# Patient Record
Sex: Female | Born: 1965 | Race: White | Hispanic: No | Marital: Married | Smoking: Never smoker
Health system: Southern US, Community
[De-identification: ages and names within clinical notes are randomized; demographics above are authoritative.]

## PROBLEM LIST (undated history)

## (undated) DIAGNOSIS — R232 Flushing: Secondary | ICD-10-CM

## (undated) DIAGNOSIS — D649 Anemia, unspecified: Secondary | ICD-10-CM

## (undated) DIAGNOSIS — J45909 Unspecified asthma, uncomplicated: Secondary | ICD-10-CM

## (undated) DIAGNOSIS — C50919 Malignant neoplasm of unspecified site of unspecified female breast: Secondary | ICD-10-CM

## (undated) DIAGNOSIS — J309 Allergic rhinitis, unspecified: Secondary | ICD-10-CM

## (undated) HISTORY — DX: Allergic rhinitis, unspecified: J30.9

## (undated) HISTORY — PX: APPENDECTOMY: SHX54

## (undated) HISTORY — PX: BREAST ENHANCEMENT SURGERY: SHX7

## (undated) HISTORY — DX: Unspecified asthma, uncomplicated: J45.909

## (undated) HISTORY — PX: TRANSFER ADJACENT TISSUE TRUNK: SUR1372

## (undated) HISTORY — PX: MASTECTOMY: SHX3

---

## 1998-07-12 ENCOUNTER — Other Ambulatory Visit: Admission: RE | Admit: 1998-07-12 | Discharge: 1998-07-12 | Payer: Self-pay | Admitting: Obstetrics and Gynecology

## 1999-01-23 ENCOUNTER — Other Ambulatory Visit: Admission: RE | Admit: 1999-01-23 | Discharge: 1999-01-23 | Payer: Self-pay | Admitting: Obstetrics and Gynecology

## 1999-03-07 ENCOUNTER — Encounter (INDEPENDENT_AMBULATORY_CARE_PROVIDER_SITE_OTHER): Payer: Self-pay

## 1999-03-07 ENCOUNTER — Other Ambulatory Visit: Admission: RE | Admit: 1999-03-07 | Discharge: 1999-03-07 | Payer: Self-pay | Admitting: Obstetrics and Gynecology

## 1999-09-23 ENCOUNTER — Other Ambulatory Visit: Admission: RE | Admit: 1999-09-23 | Discharge: 1999-09-23 | Payer: Self-pay | Admitting: Obstetrics and Gynecology

## 1999-09-24 ENCOUNTER — Encounter (INDEPENDENT_AMBULATORY_CARE_PROVIDER_SITE_OTHER): Payer: Self-pay | Admitting: Specialist

## 1999-09-24 ENCOUNTER — Other Ambulatory Visit: Admission: RE | Admit: 1999-09-24 | Discharge: 1999-09-24 | Payer: Self-pay | Admitting: Obstetrics and Gynecology

## 2000-04-07 ENCOUNTER — Other Ambulatory Visit: Admission: RE | Admit: 2000-04-07 | Discharge: 2000-04-07 | Payer: Self-pay | Admitting: Obstetrics and Gynecology

## 2000-05-27 ENCOUNTER — Encounter (INDEPENDENT_AMBULATORY_CARE_PROVIDER_SITE_OTHER): Payer: Self-pay | Admitting: Specialist

## 2000-05-27 ENCOUNTER — Other Ambulatory Visit: Admission: RE | Admit: 2000-05-27 | Discharge: 2000-05-27 | Payer: Self-pay | Admitting: Obstetrics and Gynecology

## 2000-07-07 ENCOUNTER — Other Ambulatory Visit: Admission: RE | Admit: 2000-07-07 | Discharge: 2000-07-07 | Payer: Self-pay | Admitting: Obstetrics and Gynecology

## 2001-05-19 ENCOUNTER — Other Ambulatory Visit: Admission: RE | Admit: 2001-05-19 | Discharge: 2001-05-19 | Payer: Self-pay | Admitting: Obstetrics and Gynecology

## 2002-06-09 ENCOUNTER — Other Ambulatory Visit: Admission: RE | Admit: 2002-06-09 | Discharge: 2002-06-09 | Payer: Self-pay | Admitting: Obstetrics and Gynecology

## 2003-06-13 ENCOUNTER — Other Ambulatory Visit: Admission: RE | Admit: 2003-06-13 | Discharge: 2003-06-13 | Payer: Self-pay | Admitting: Obstetrics and Gynecology

## 2004-06-19 ENCOUNTER — Other Ambulatory Visit: Admission: RE | Admit: 2004-06-19 | Discharge: 2004-06-19 | Payer: Self-pay | Admitting: Obstetrics and Gynecology

## 2005-07-15 ENCOUNTER — Other Ambulatory Visit: Admission: RE | Admit: 2005-07-15 | Discharge: 2005-07-15 | Payer: Self-pay | Admitting: Obstetrics and Gynecology

## 2007-11-17 ENCOUNTER — Encounter (INDEPENDENT_AMBULATORY_CARE_PROVIDER_SITE_OTHER): Payer: Self-pay | Admitting: Diagnostic Radiology

## 2007-11-17 ENCOUNTER — Encounter: Admission: RE | Admit: 2007-11-17 | Discharge: 2007-11-17 | Payer: Self-pay | Admitting: Obstetrics and Gynecology

## 2007-11-24 ENCOUNTER — Encounter: Admission: RE | Admit: 2007-11-24 | Discharge: 2007-11-24 | Payer: Self-pay | Admitting: Obstetrics and Gynecology

## 2007-11-25 ENCOUNTER — Encounter (INDEPENDENT_AMBULATORY_CARE_PROVIDER_SITE_OTHER): Payer: Self-pay | Admitting: Diagnostic Radiology

## 2007-11-25 ENCOUNTER — Encounter: Admission: RE | Admit: 2007-11-25 | Discharge: 2007-11-25 | Payer: Self-pay | Admitting: Obstetrics and Gynecology

## 2007-12-01 ENCOUNTER — Ambulatory Visit: Payer: Self-pay | Admitting: Hematology

## 2008-01-20 IMAGING — CR DG CHEST 2V
2 series · 2 of 2 positions shown · non-contrast
Comparison: None

CLINICAL DATA: History of left breast carcinoma.

CHEST - 2 VIEW

[view not recorded (1 of 2)]
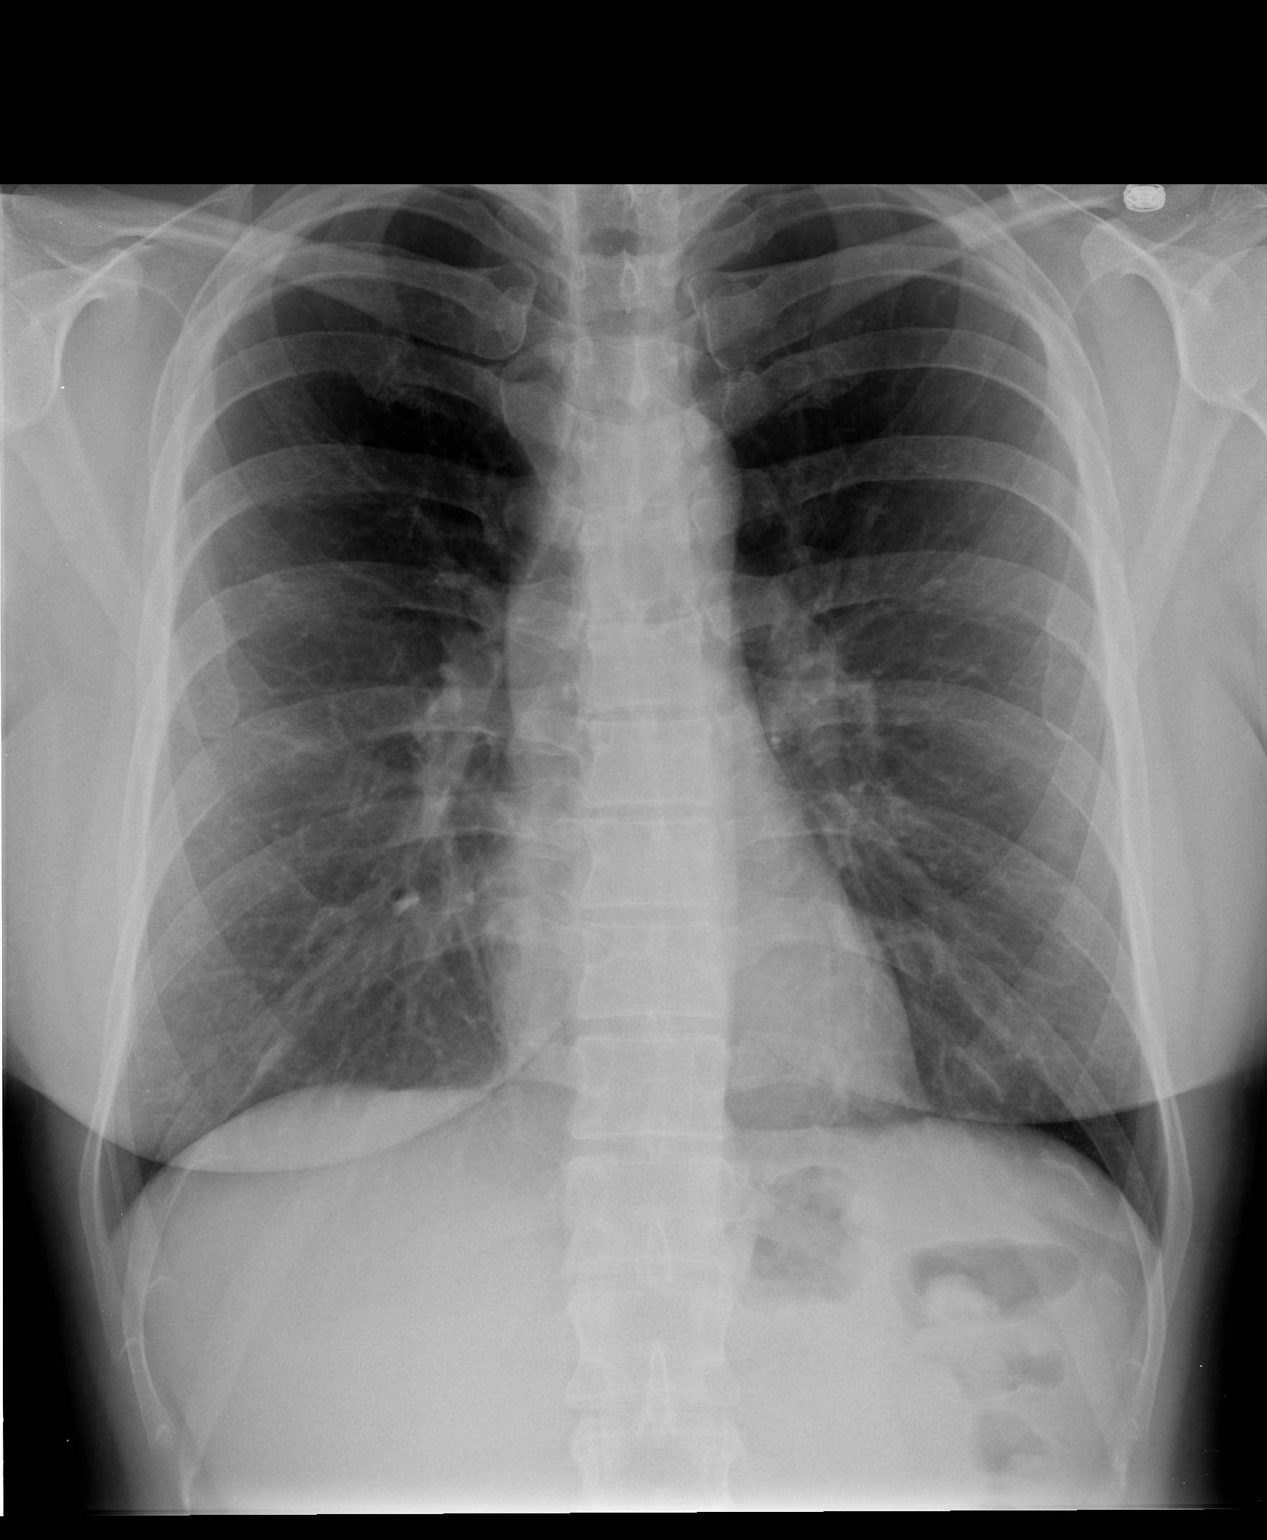

[view not recorded (2 of 2)]
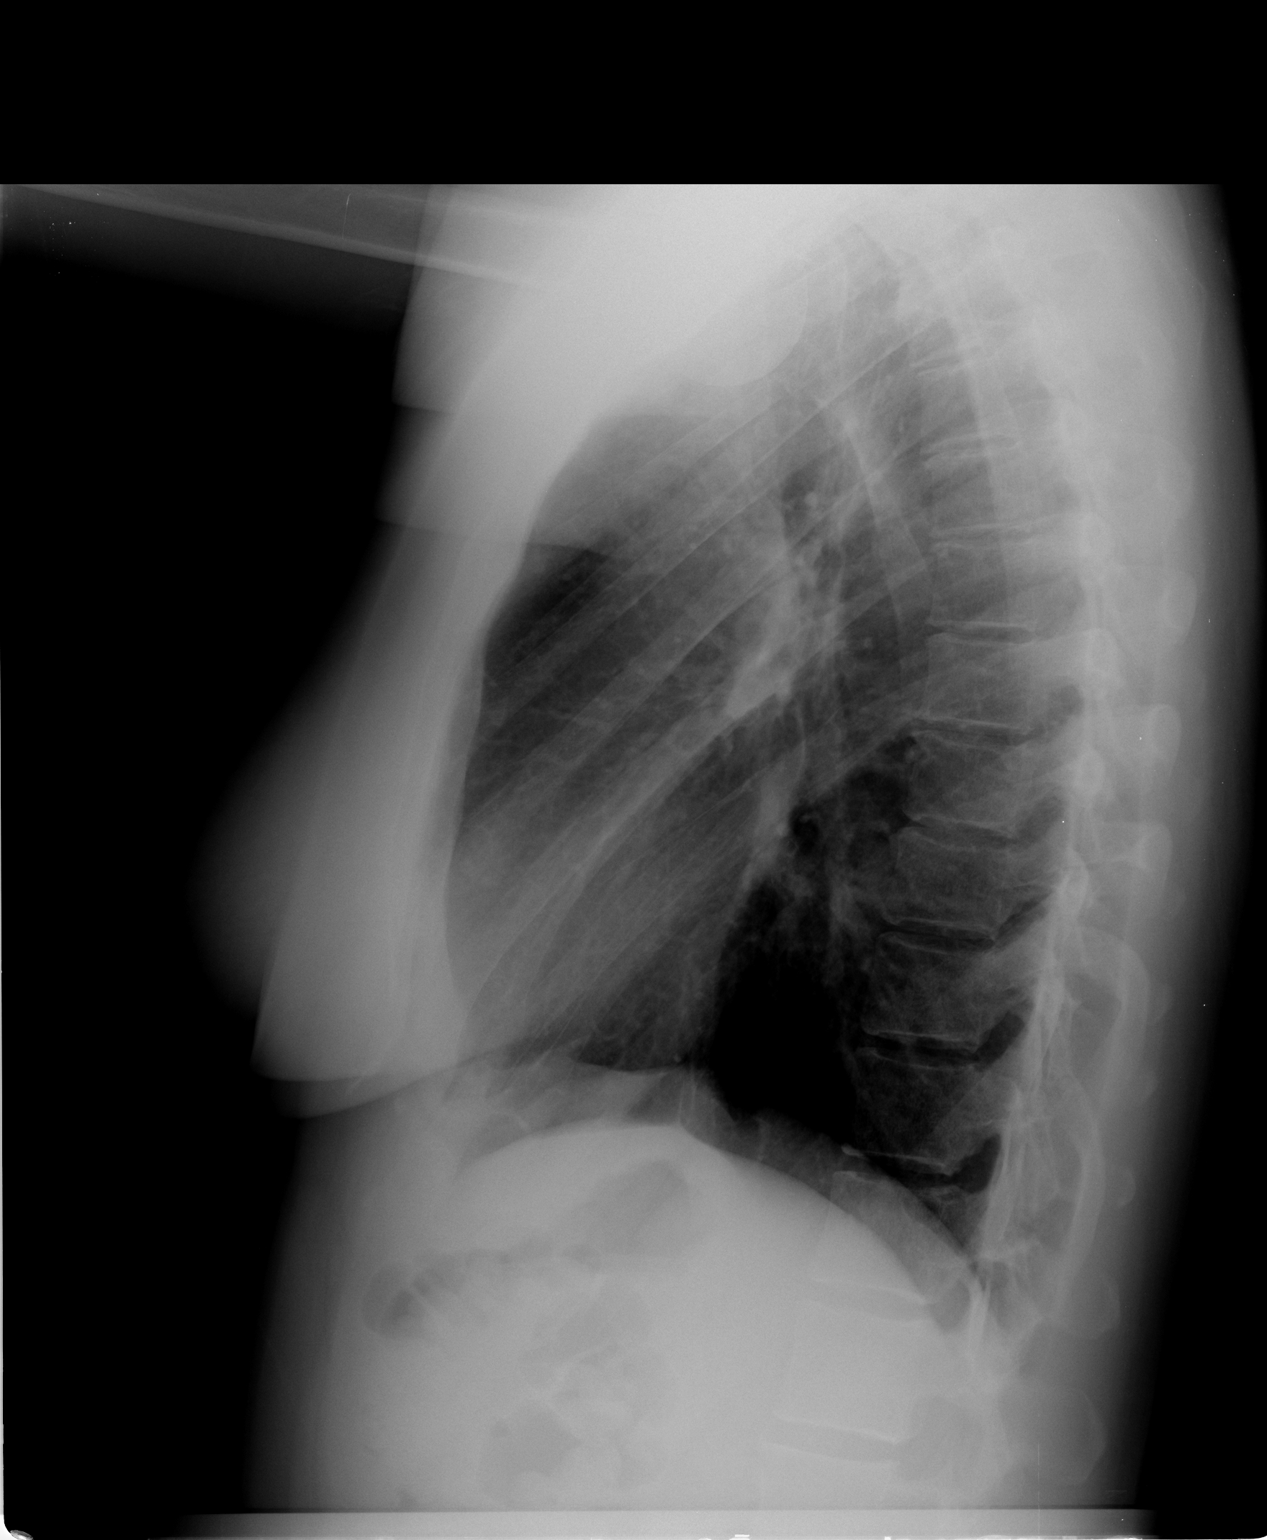

[2 of 2 positions shown; findings below may reference images not displayed]

FINDINGS: Cardiac silhouette is normal size and shape.  Lungs are
free of infiltrates.  No pleural effusion, adenopathy, or
mediastinal lesion is seen.  No pleural disease is evident.  Bones
appear average for age with only minimal degenerative spondylosis.
IMPRESSION: No acute or active process seen.

## 2008-01-24 ENCOUNTER — Encounter (INDEPENDENT_AMBULATORY_CARE_PROVIDER_SITE_OTHER): Payer: Self-pay | Admitting: General Surgery

## 2008-01-24 ENCOUNTER — Ambulatory Visit (HOSPITAL_COMMUNITY): Admission: RE | Admit: 2008-01-24 | Discharge: 2008-01-25 | Payer: Self-pay | Admitting: General Surgery

## 2008-01-31 ENCOUNTER — Ambulatory Visit: Payer: Self-pay | Admitting: Oncology

## 2008-02-16 LAB — CBC WITH DIFFERENTIAL/PLATELET
BASO%: 0.3 % (ref 0.0–2.0)
Basophils Absolute: 0 10*3/uL (ref 0.0–0.1)
EOS%: 8.8 % — ABNORMAL HIGH (ref 0.0–7.0)
HGB: 14.1 g/dL (ref 11.6–15.9)
MCH: 32.5 pg (ref 26.0–34.0)
MCHC: 34.9 g/dL (ref 32.0–36.0)
MCV: 93.1 fL (ref 81.0–101.0)
MONO%: 10.1 % (ref 0.0–13.0)
RDW: 11.9 % (ref 11.3–14.5)

## 2008-02-17 LAB — COMPREHENSIVE METABOLIC PANEL
ALT: 140 U/L — ABNORMAL HIGH (ref 0–35)
AST: 73 U/L — ABNORMAL HIGH (ref 0–37)
Albumin: 4.5 g/dL (ref 3.5–5.2)
Alkaline Phosphatase: 69 U/L (ref 39–117)
BUN: 12 mg/dL (ref 6–23)
Creatinine, Ser: 0.89 mg/dL (ref 0.40–1.20)
Potassium: 4.2 mEq/L (ref 3.5–5.3)

## 2008-02-17 LAB — VITAMIN D 25 HYDROXY (VIT D DEFICIENCY, FRACTURES): Vit D, 25-Hydroxy: 30 ng/mL (ref 30–89)

## 2008-02-22 ENCOUNTER — Ambulatory Visit (HOSPITAL_COMMUNITY): Admission: RE | Admit: 2008-02-22 | Discharge: 2008-02-22 | Payer: Self-pay | Admitting: Oncology

## 2008-03-09 LAB — HEPATIC FUNCTION PANEL
Bilirubin, Direct: 0.1 mg/dL (ref 0.0–0.3)
Total Bilirubin: 0.5 mg/dL (ref 0.3–1.2)

## 2008-03-17 ENCOUNTER — Ambulatory Visit: Payer: Self-pay | Admitting: Oncology

## 2008-03-17 LAB — CBC WITH DIFFERENTIAL/PLATELET
Eosinophils Absolute: 0 10*3/uL (ref 0.0–0.5)
HCT: 34.5 % — ABNORMAL LOW (ref 34.8–46.6)
HGB: 11.9 g/dL (ref 11.6–15.9)
LYMPH%: 5.5 % — ABNORMAL LOW (ref 14.0–48.0)
MONO#: 1.5 10*3/uL — ABNORMAL HIGH (ref 0.1–0.9)
NEUT#: 34.9 10*3/uL — ABNORMAL HIGH (ref 1.5–6.5)
NEUT%: 90.5 % — ABNORMAL HIGH (ref 39.6–76.8)
Platelets: 196 10*3/uL (ref 145–400)
WBC: 38.5 10*3/uL — ABNORMAL HIGH (ref 3.9–10.0)

## 2008-03-31 LAB — CBC WITH DIFFERENTIAL/PLATELET
Eosinophils Absolute: 0 10*3/uL (ref 0.0–0.5)
LYMPH%: 6.3 % — ABNORMAL LOW (ref 14.0–48.0)
MCH: 31 pg (ref 26.0–34.0)
MCHC: 35.2 g/dL (ref 32.0–36.0)
MCV: 88.1 fL (ref 81.0–101.0)
MONO%: 10.7 % (ref 0.0–13.0)
NEUT#: 14.7 10*3/uL — ABNORMAL HIGH (ref 1.5–6.5)
Platelets: 417 10*3/uL — ABNORMAL HIGH (ref 145–400)
RBC: 4.18 10*6/uL (ref 3.70–5.32)

## 2008-03-31 LAB — COMPREHENSIVE METABOLIC PANEL
AST: 68 U/L — ABNORMAL HIGH (ref 0–37)
Albumin: 4.5 g/dL (ref 3.5–5.2)
BUN: 12 mg/dL (ref 6–23)
Calcium: 9.7 mg/dL (ref 8.4–10.5)
Chloride: 105 mEq/L (ref 96–112)
Potassium: 4.1 mEq/L (ref 3.5–5.3)

## 2008-04-07 ENCOUNTER — Ambulatory Visit: Admission: RE | Admit: 2008-04-07 | Discharge: 2008-04-18 | Payer: Self-pay | Admitting: Radiation Oncology

## 2008-04-07 LAB — CBC WITH DIFFERENTIAL/PLATELET
BASO%: 0.1 % (ref 0.0–2.0)
Basophils Absolute: 0 10*3/uL (ref 0.0–0.1)
EOS%: 1.1 % (ref 0.0–7.0)
HGB: 12 g/dL (ref 11.6–15.9)
MCH: 32 pg (ref 26.0–34.0)
MCHC: 35 g/dL (ref 32.0–36.0)
RDW: 12.5 % (ref 11.3–14.5)
lymph#: 1.1 10*3/uL (ref 0.9–3.3)

## 2008-04-28 ENCOUNTER — Ambulatory Visit: Payer: Self-pay | Admitting: Oncology

## 2008-04-28 LAB — CBC WITH DIFFERENTIAL/PLATELET
Basophils Absolute: 0 10*3/uL (ref 0.0–0.1)
HCT: 38.5 % (ref 34.8–46.6)
HGB: 13.3 g/dL (ref 11.6–15.9)
MONO#: 0.5 10*3/uL (ref 0.1–0.9)
NEUT%: 90.1 % — ABNORMAL HIGH (ref 39.6–76.8)
WBC: 14.1 10*3/uL — ABNORMAL HIGH (ref 3.9–10.0)
lymph#: 0.8 10*3/uL — ABNORMAL LOW (ref 0.9–3.3)

## 2008-04-28 LAB — COMPREHENSIVE METABOLIC PANEL
ALT: 178 U/L — ABNORMAL HIGH (ref 0–35)
BUN: 14 mg/dL (ref 6–23)
CO2: 25 mEq/L (ref 19–32)
Calcium: 9.3 mg/dL (ref 8.4–10.5)
Chloride: 103 mEq/L (ref 96–112)
Creatinine, Ser: 0.72 mg/dL (ref 0.40–1.20)

## 2008-05-05 LAB — CBC WITH DIFFERENTIAL/PLATELET
BASO%: 0.3 % (ref 0.0–2.0)
HCT: 33.9 % — ABNORMAL LOW (ref 34.8–46.6)
HGB: 11.6 g/dL (ref 11.6–15.9)
MCHC: 34.3 g/dL (ref 32.0–36.0)
MONO#: 2.3 10*3/uL — ABNORMAL HIGH (ref 0.1–0.9)
NEUT%: 80.6 % — ABNORMAL HIGH (ref 39.6–76.8)
RDW: 14 % (ref 11.3–14.5)
WBC: 20.3 10*3/uL — ABNORMAL HIGH (ref 3.9–10.0)
lymph#: 1.3 10*3/uL (ref 0.9–3.3)

## 2008-05-05 LAB — COMPREHENSIVE METABOLIC PANEL
ALT: 66 U/L — ABNORMAL HIGH (ref 0–35)
Albumin: 3.5 g/dL (ref 3.5–5.2)
CO2: 26 mEq/L (ref 19–32)
Calcium: 8.6 mg/dL (ref 8.4–10.5)
Chloride: 103 mEq/L (ref 96–112)
Creatinine, Ser: 0.78 mg/dL (ref 0.40–1.20)
Potassium: 3.6 mEq/L (ref 3.5–5.3)
Total Protein: 6.2 g/dL (ref 6.0–8.3)

## 2008-05-19 LAB — CBC WITH DIFFERENTIAL/PLATELET
Basophils Absolute: 0.3 10*3/uL — ABNORMAL HIGH (ref 0.0–0.1)
Eosinophils Absolute: 0 10*3/uL (ref 0.0–0.5)
HCT: 36.8 % (ref 34.8–46.6)
LYMPH%: 3.3 % — ABNORMAL LOW (ref 14.0–48.0)
MCV: 92.2 fL (ref 81.0–101.0)
MONO%: 4.4 % (ref 0.0–13.0)
NEUT%: 90.5 % — ABNORMAL HIGH (ref 39.6–76.8)
Platelets: 279 10*3/uL (ref 145–400)
WBC: 16.3 10*3/uL — ABNORMAL HIGH (ref 3.9–10.0)

## 2008-05-26 LAB — CBC WITH DIFFERENTIAL/PLATELET
Basophils Absolute: 0 10*3/uL (ref 0.0–0.1)
EOS%: 0.7 % (ref 0.0–7.0)
HCT: 35.2 % (ref 34.8–46.6)
HGB: 12.2 g/dL (ref 11.6–15.9)
MCH: 32.1 pg (ref 26.0–34.0)
MCV: 92.7 fL (ref 81.0–101.0)
MONO%: 14.8 % — ABNORMAL HIGH (ref 0.0–13.0)
NEUT%: 76 % (ref 39.6–76.8)

## 2008-05-26 LAB — COMPREHENSIVE METABOLIC PANEL
AST: 24 U/L (ref 0–37)
Alkaline Phosphatase: 105 U/L (ref 39–117)
BUN: 10 mg/dL (ref 6–23)
Calcium: 9.1 mg/dL (ref 8.4–10.5)
Chloride: 103 mEq/L (ref 96–112)
Creatinine, Ser: 0.85 mg/dL (ref 0.40–1.20)
Glucose, Bld: 89 mg/dL (ref 70–99)

## 2008-07-04 ENCOUNTER — Encounter (INDEPENDENT_AMBULATORY_CARE_PROVIDER_SITE_OTHER): Payer: Self-pay | Admitting: Plastic Surgery

## 2008-07-04 ENCOUNTER — Inpatient Hospital Stay (HOSPITAL_COMMUNITY): Admission: RE | Admit: 2008-07-04 | Discharge: 2008-07-06 | Payer: Self-pay | Admitting: Plastic Surgery

## 2008-08-14 ENCOUNTER — Ambulatory Visit: Payer: Self-pay | Admitting: Oncology

## 2008-08-16 LAB — CBC WITH DIFFERENTIAL/PLATELET
BASO%: 0.3 % (ref 0.0–2.0)
EOS%: 5.4 % (ref 0.0–7.0)
MCH: 31.9 pg (ref 25.1–34.0)
MCHC: 34.7 g/dL (ref 31.5–36.0)
MONO#: 0.6 10*3/uL (ref 0.1–0.9)
RDW: 12.5 % (ref 11.2–14.5)
WBC: 4.7 10*3/uL (ref 3.9–10.3)
lymph#: 1 10*3/uL (ref 0.9–3.3)

## 2008-08-17 LAB — COMPREHENSIVE METABOLIC PANEL
ALT: 12 U/L (ref 0–35)
AST: 15 U/L (ref 0–37)
Albumin: 4.2 g/dL (ref 3.5–5.2)
CO2: 26 mEq/L (ref 19–32)
Calcium: 9.1 mg/dL (ref 8.4–10.5)
Chloride: 102 mEq/L (ref 96–112)
Potassium: 4.2 mEq/L (ref 3.5–5.3)
Sodium: 138 mEq/L (ref 135–145)
Total Protein: 6.9 g/dL (ref 6.0–8.3)

## 2008-10-19 ENCOUNTER — Encounter: Admission: RE | Admit: 2008-10-19 | Discharge: 2008-10-19 | Payer: Self-pay | Admitting: Oncology

## 2009-02-19 ENCOUNTER — Ambulatory Visit: Payer: Self-pay | Admitting: Oncology

## 2009-02-21 LAB — COMPREHENSIVE METABOLIC PANEL
AST: 23 U/L (ref 0–37)
Albumin: 3.7 g/dL (ref 3.5–5.2)
Alkaline Phosphatase: 67 U/L (ref 39–117)
BUN: 12 mg/dL (ref 6–23)
Calcium: 8.9 mg/dL (ref 8.4–10.5)
Chloride: 101 mEq/L (ref 96–112)
Creatinine, Ser: 0.94 mg/dL (ref 0.40–1.20)
Glucose, Bld: 100 mg/dL — ABNORMAL HIGH (ref 70–99)
Potassium: 3.9 mEq/L (ref 3.5–5.3)

## 2009-02-21 LAB — CBC WITH DIFFERENTIAL/PLATELET
BASO%: 0.3 % (ref 0.0–2.0)
HCT: 38.2 % (ref 34.8–46.6)
MCHC: 34.4 g/dL (ref 31.5–36.0)
MONO#: 0.7 10*3/uL (ref 0.1–0.9)
NEUT#: 3.6 10*3/uL (ref 1.5–6.5)
NEUT%: 62.5 % (ref 38.4–76.8)
RBC: 4 10*6/uL (ref 3.70–5.45)
WBC: 5.8 10*3/uL (ref 3.9–10.3)
lymph#: 1.2 10*3/uL (ref 0.9–3.3)

## 2009-02-22 LAB — VITAMIN D 25 HYDROXY (VIT D DEFICIENCY, FRACTURES): Vit D, 25-Hydroxy: 31 ng/mL (ref 30–89)

## 2009-02-22 LAB — CANCER ANTIGEN 27.29: CA 27.29: 18 U/mL (ref 0–39)

## 2009-04-23 ENCOUNTER — Ambulatory Visit: Payer: Self-pay | Admitting: Oncology

## 2009-10-16 ENCOUNTER — Ambulatory Visit: Payer: Self-pay | Admitting: Oncology

## 2009-10-18 LAB — COMPREHENSIVE METABOLIC PANEL
Albumin: 4.1 g/dL (ref 3.5–5.2)
BUN: 9 mg/dL (ref 6–23)
Calcium: 9 mg/dL (ref 8.4–10.5)
Chloride: 106 mEq/L (ref 96–112)
Creatinine, Ser: 0.87 mg/dL (ref 0.40–1.20)
Glucose, Bld: 103 mg/dL — ABNORMAL HIGH (ref 70–99)
Sodium: 139 mEq/L (ref 135–145)
Total Bilirubin: 0.3 mg/dL (ref 0.3–1.2)

## 2009-10-18 LAB — CBC WITH DIFFERENTIAL/PLATELET
BASO%: 0.6 % (ref 0.0–2.0)
Basophils Absolute: 0 10*3/uL (ref 0.0–0.1)
MCHC: 34 g/dL (ref 31.5–36.0)
MONO%: 11.3 % (ref 0.0–14.0)
NEUT#: 3.4 10*3/uL (ref 1.5–6.5)
RBC: 3.95 10*6/uL (ref 3.70–5.45)
lymph#: 1.2 10*3/uL (ref 0.9–3.3)

## 2009-10-18 LAB — CANCER ANTIGEN 27.29: CA 27.29: 22 U/mL (ref 0–39)

## 2009-10-18 LAB — LACTATE DEHYDROGENASE: LDH: 161 U/L (ref 94–250)

## 2009-10-25 ENCOUNTER — Encounter: Admission: RE | Admit: 2009-10-25 | Discharge: 2009-10-25 | Payer: Self-pay | Admitting: Oncology

## 2009-10-29 LAB — ESTRADIOL, ULTRA SENS

## 2009-12-18 ENCOUNTER — Ambulatory Visit (HOSPITAL_BASED_OUTPATIENT_CLINIC_OR_DEPARTMENT_OTHER): Payer: BC Managed Care – PPO | Admitting: Oncology

## 2010-05-28 ENCOUNTER — Encounter (HOSPITAL_BASED_OUTPATIENT_CLINIC_OR_DEPARTMENT_OTHER): Payer: BC Managed Care – PPO | Admitting: Oncology

## 2010-05-28 DIAGNOSIS — C50319 Malignant neoplasm of lower-inner quadrant of unspecified female breast: Secondary | ICD-10-CM

## 2010-05-28 DIAGNOSIS — C50919 Malignant neoplasm of unspecified site of unspecified female breast: Secondary | ICD-10-CM

## 2010-05-28 LAB — CBC WITH DIFFERENTIAL/PLATELET
BASO%: 0.4 % (ref 0.0–2.0)
EOS%: 4.6 % (ref 0.0–7.0)
HGB: 13.1 g/dL (ref 11.6–15.9)
MCH: 30.4 pg (ref 25.1–34.0)
MCV: 89.2 fL (ref 79.5–101.0)
MONO%: 9.7 % (ref 0.0–14.0)
RBC: 4.3 10*6/uL (ref 3.70–5.45)
WBC: 8.4 10*3/uL (ref 3.9–10.3)

## 2010-05-28 LAB — COMPREHENSIVE METABOLIC PANEL
ALT: 17 U/L (ref 0–35)
AST: 22 U/L (ref 0–37)
Albumin: 3.8 g/dL (ref 3.5–5.2)
BUN: 10 mg/dL (ref 6–23)
CO2: 29 mEq/L (ref 19–32)
Chloride: 102 mEq/L (ref 96–112)
Glucose, Bld: 93 mg/dL (ref 70–99)
Total Bilirubin: 0.5 mg/dL (ref 0.3–1.2)
Total Protein: 7.1 g/dL (ref 6.0–8.3)

## 2010-05-28 LAB — CANCER ANTIGEN 27.29: CA 27.29: 13 U/mL (ref 0–39)

## 2010-06-07 ENCOUNTER — Other Ambulatory Visit: Payer: Self-pay | Admitting: Obstetrics and Gynecology

## 2010-06-07 ENCOUNTER — Ambulatory Visit (HOSPITAL_COMMUNITY)
Admission: RE | Admit: 2010-06-07 | Discharge: 2010-06-07 | Disposition: A | Discharge status: Home / Self Care | Payer: BC Managed Care – PPO | Source: Ambulatory Visit | Attending: Obstetrics and Gynecology | Admitting: Obstetrics and Gynecology

## 2010-06-07 DIAGNOSIS — N915 Oligomenorrhea, unspecified: Secondary | ICD-10-CM | POA: Insufficient documentation

## 2010-06-07 DIAGNOSIS — N83209 Unspecified ovarian cyst, unspecified side: Secondary | ICD-10-CM | POA: Insufficient documentation

## 2010-06-07 DIAGNOSIS — Z853 Personal history of malignant neoplasm of breast: Secondary | ICD-10-CM | POA: Insufficient documentation

## 2010-06-07 DIAGNOSIS — N840 Polyp of corpus uteri: Secondary | ICD-10-CM | POA: Insufficient documentation

## 2010-06-07 LAB — CBC
HCT: 40.4 % (ref 36.0–46.0)
Hemoglobin: 13.3 g/dL (ref 12.0–15.0)
MCH: 29.6 pg (ref 26.0–34.0)
RBC: 4.5 MIL/uL (ref 3.87–5.11)
WBC: 6.7 10*3/uL (ref 4.0–10.5)

## 2010-06-12 NOTE — H&P (Signed)
  NAMEGIARA, [name] [name] NO.:  [id number]  MEDICAL RECORD NO.:  [id number]        PATIENT TYPE:  WAMB  LOCATION:                                FACILITY:  [facility]  PHYSICIAN:  [name], M.D.   DATE OF BIRTH:  01/14/1966  DATE OF ADMISSION:  06/07/2010 DATE OF DISCHARGE:                             HISTORY & PHYSICAL   The patient is a 45 year old, gravida 2, para 2, female who presents for hysteroscopy.  In relation to the present admission, the patient has a history of oligomenorrhea.  Is on tamoxifen for past history of breast cancer.  She underwent a saline infusion ultrasound had numerous endometrial polyps that were noted.  Also had a 3.5-cm simple cyst in the right ovary.  It was felt that that was a functional cyst, but she now presents for a hysteroscopic resection of the polyp.  In terms of allergies, ho known drug allergies.  MEDICATIONS:  She is on tamoxifen daily.  She is on Effexor as well as vitamin replacements.  PAST MEDICAL HISTORY:  Significant in that she has had a history of breast cancer diagnosed in 2010, otherwise, usual childhood diseases.  In terms of past surgical history, this includes an appendectomy, C- section, LASIK, and she did have a left mastectomy with reconstruction using a TRAM flap.  FAMILY HISTORY:  Noncontributory.  SOCIAL HISTORY:  No tobacco, alcohol use.  REVIEW OF SYSTEMS:  Noncontributory.  PHYSICAL EXAMINATION:  VITAL SIGNS:  The patient is afebrile, stable vital signs. HEENT:  The patient is normocephalic.  Pupils equal, round, and reactive to light and accommodation.  Extraocular movements were intact.  Sclerae and conjunctivae clear.  Oropharynx clear. NECK:  Not examined. BREASTS:  The patient has had previous left mastectomy with TRAM flap. There are no other abnormalities.  Right breast is unremarkable.  No masses, adenopathy, or nipple discharge. LUNGS:  Clear. CARDIOVASCULAR:  Regular rate.   No murmurs or gallops. ABDOMEN:  Benign.  No mass, organomegaly, or tenderness.  Does have a transverse incision and periumbilical incision for formation of the TRAM flap. PELVIC:  Normal external genitalia.  Vaginal mucosa is clear.  Cervix unremarkable.  Uterus normal size, shape, and contour.  Adnexa free of masses or tenderness. EXTREMITIES:  Trace edema. NEUROLOGIC:  Grossly normal limits.  IMPRESSION:  Endometrial polyps. 1. History of breast cancer on tamoxifen.  PLAN:  The patient undergo hysteroscopy evaluation resection.  The risks of surgery have been discussed with the risk of infection.  The risk of hemorrhage that could require transfusion with the risk of AIDS or hepatitis..  Excessive bleeding could require hysterectomy as a risk of perforation of injury to injury to adjacent organs requiring exploratory surgery.  Risk of deep venous thrombosis and pulmonary embolus.  The patient does understand potential risks and complications.     [name], M.D.     JSM/MEDQ  D:  06/07/2010  T:  06/07/2010  Job:  [id number]  Electronically Signed by Richardean Chimera M.D. on 06/12/2010 07:46:59 AM

## 2010-06-12 NOTE — Op Note (Signed)
  [name] [name], [name] [name]                ACCOUNT NO.:  [id number]  MEDICAL RECORD NO.:  [id number]           PATIENT TYPE:  O  LOCATION:  [facility]                          FACILITY:  [facility]  PHYSICIAN:  [name], M.D.   DATE OF BIRTH:  [birth year]  DATE OF PROCEDURE:  06/07/2010 DATE OF DISCHARGE:                              OPERATIVE REPORT   PREOPERATIVE DIAGNOSIS:  Endometrial polyps.  POSTOPERATIVE DIAGNOSIS:  Thickened endometrium probably from tamoxifen.  OPERATIVE PROCEDURE:  Paracervical block, hysteroscopy with multiple endometrial resections as well as endometrial curettings.  ANESTHESIA:  Paracervical block with general anesthesia.  ESTIMATED BLOOD LOSS:  Minimal.  PACKS AND DRAINS:  None.  INTRAOPERATIVE BLOOD PLACED:  None.  COMPLICATIONS:  None.  INDICATIONS:  Dictated in the history and physical.  PROCEDURE:  The patient was taken to the OR and placed in supine position.  After satisfactory level of general anesthesia was obtained, the patient was placed in dorsal lithotomy position using the Allen stirrups.  The perineum and vagina prepped out with Betadine and draped in sterile field.  A speculum was placed in the vaginal vault.  The cervix was single-tooth tenaculum.  A paracervical block of 1% Xylocaine with epinephrine was then instituted.  Uterus sounded to approximately 10 cm.  Cervix serially dilated to a size 31 Pratt dilator.  Operative hysteroscope was introduced, intrauterine cavity was distended using sorbitol.  Visualization revealed a typical findings of the endometrium when the patient had been on tamoxifen.  She had multiple irregularities with cystic structures.  Using the resectoscope, we actually resected the majority of the endometrium.  This all sent for pathological review. Total deficit was about 350 mL.  At this point in time, we obtained endometrial curettings.  There is no active bleeding signs of complications or perforations.  At  this point in time, single-tooth and speculum then removed.  The patient taken out of dorsal position once alert, extubated, and transferred to recovery room in good condition.     [name], M.D.     JSM/MEDQ  D:  06/07/2010  T:  06/07/2010  Job:  [id number]  Electronically Signed by Richardean Chimera M.D. on 06/12/2010 07:47:01 AM

## 2010-08-01 LAB — BASIC METABOLIC PANEL
CO2: 25 mEq/L (ref 19–32)
Creatinine, Ser: 0.71 mg/dL (ref 0.4–1.2)
GFR calc Af Amer: >60 mL/min (ref >60)
Potassium: 4.5 mEq/L (ref 3.5–5.1)
Sodium: 138 mEq/L (ref 135–145)

## 2010-08-01 LAB — HEMOGLOBIN AND HEMATOCRIT, BLOOD
HCT: 33.4 % — ABNORMAL LOW (ref 36.0–46.0)
Hemoglobin: 11.8 g/dL — ABNORMAL LOW (ref 12.0–15.0)

## 2010-08-01 LAB — CBC
HCT: 38 % (ref 36.0–46.0)
Hemoglobin: 13.5 g/dL (ref 12.0–15.0)
MCV: 94.3 fL (ref 78.0–100.0)
RDW: 13.5 % (ref 11.5–15.5)

## 2010-09-03 NOTE — Op Note (Signed)
NAMEJANASHIA, [name] [name]              ACCOUNT NO.:  [id number]   MEDICAL RECORD NO.:  [id number]          PATIENT TYPE:  OIB   LOCATION:  5127                         FACILITY:  [facility]   PHYSICIAN:  Ollen Gross. Vernell Morgans, M.D. DATE OF BIRTH:  01-20-1966   DATE OF PROCEDURE:  01/24/2008  DATE OF DISCHARGE:                               OPERATIVE REPORT   PREOPERATIVE DIAGNOSIS:  Left breast cancer.   POSTOPERATIVE DIAGNOSIS:  Left breast cancer.   PROCEDURE:  Left mastectomy and sentinel node biopsy with injection of  blue dye.   SURGEON:  Ollen Gross. Vernell Morgans, MD   ASSISTANT:  Magnus Ivan, RNFA   ANESTHESIA:  General endotracheal.   PROCEDURE:  After informed consent was obtained, the patient was brought  to the operating room and placed in the supine position on the operating  table.  After adequate induction of general anesthesia, the patient's  left breast, chest, and axilla were prepped with Betadine and draped in  usual sterile manner.  Earlier in the day, the patient undergone  injection of 1 mCi of technetium sulfur colloid in the subareolar  position.  At this point, 2 mL of methylene blue and 3 mL of injectable  saline were also injected in the subareolar position.  The breast was  massaged for several minutes.  An elliptical type incision was made  around the nipple-areolar complex so as to minimize excess skin.  This  incision was then carried down through the skin and subcutaneous tissue  sharply with the 10-blade knife and electrocautery.  Skin hooks were  used to elevate the skin flaps towards the ceiling and thin skin flaps  were then created dissecting between the breast tissue and the  subcutaneous fat.  This was done circumferentially until the dissection  was carried down through the chest wall laterally.  Once the dissection  was carried down to the axilla, the NeoProbe was used to identify a hot  spot in the axilla.  A combination of blunt hemostat dissection and  sharp dissection was used to dissect into the tissue until a hot blue  lymph node was identified.  It was excised sharply with the  electrocautery and had ex vivo counts around 1500.  It was sent to  pathology as sentinel node #1.  Second palpable lymph node was  identified that was not hot or blue.  It was also excised sharply with  the electrocautery, and sent as nonsentinel node.  No other hot spots,  palpable nodes, or blue dye were identified in the axilla.  Next, the  inferior skin flap was created in the similar manner dissecting between  the subcutaneous fat and the breast tissue with elevating the skin flaps  towards the ceiling.  This was carried down to the chest wall as well.  Laterally, the dissection was carried between the breast tissue and  subcutaneous fat down to the latissimus muscle.  The breast tissue was  then removed from the chest wall with the pectoralis fascia.  This was  all done sharply with the electrocautery.  Once the breast was  completely  removed, it was marked with a stitch on the lateral aspect  and sent to pathology for further evaluation, and hemostasis was  achieved using the Bovie electrocautery.  The wound was irrigated with  copious amounts of saline.  A small incision was made on the anterior  axillary line laterally with a 15-blade knife.  The tonsil clamp was  placed through this opening into the bed of the wound.  A 19-French  round Harrison Mons drain was then brought into the operative bed and out  through the incision.  The drain was placed along the chest wall.  The  drain was anchored to the skin with 3-0 nylon stitch.  The subcutaneous  tissue was then grossly reapproximated with several interrupted 3-0  Vicryl stitches, and then the skin was closed with a running 4-0  Monocryl subcuticular stitch.  Dermabond dressing was applied.  The  patient tolerated the procedure well.  At the end of the case, all  needle, sponge, and instrument counts were  correct.  The patient was  then awakened and taken to recovery room in stable condition.      Ollen Gross. Vernell Morgans, M.D.  Electronically Signed     PST/MEDQ  D:  01/24/2008  T:  01/25/2008  Job:  [id number]

## 2010-09-03 NOTE — Discharge Summary (Signed)
[name], [name] [name]              ACCOUNT NO.:  [id number]   MEDICAL RECORD NO.:  [id number]          PATIENT TYPE:  INP   LOCATION:  5153                         FACILITY:  [facility]   PHYSICIAN:  Etter Sjogren, M.D.     DATE OF BIRTH:  12-18-1965   DATE OF ADMISSION:  07/04/2008  DATE OF DISCHARGE:  07/06/2008                               DISCHARGE SUMMARY   FINAL DIAGNOSIS:  Left breast cancer required absence of the breast.   PROCEDURE:  Ipsilateral transverse rectus abdominis myocutaneous flap  and reconstruction of abdominal wall with mesh, placement of On-Q  catheter, all of that on July 04, 2008.   SUMMARY OF HISTORY AND PHYSICAL:  A 45 year old woman with breast  cancer, has had mastectomy, has had delay of a TRAM flap and has done  well from that.  She presented this time for the transfer of the TRAM  flap and reconstruction of her left breast.  Procedure risks as well as  complications discussed with her in detail and she understood those  risks and wished to proceed.  For the details of history and physical,  please see chart.   COURSE IN THE HOSPITAL:  On admission, she was taken to the Surgery, at  which time the TRAM reconstruction was performed.  She tolerated that  procedure well.  Postoperatively, she did extremely well.  Her  postoperative hemoglobin was 11.8 the following day, her preop was 13.5.  She was tolerating a diet on first postoperative day, ambulating,  voiding excellent urine output.  Flap had excellent color and no  evidence of any vascular compromise.  Abdomen also excellent color for  all of the abdominal skin as well the umbilicus.  No evidence of any  hematoma or any healing problems at this point.  Drains functioning 1  bulb on the second postoperative day was having little bit trouble  holding the charge for the expected length of time and that bulb was  replaced prior to her discharge.  She reports very very minimal  discomfort not requiring  any pain medication at the present time.   DISPOSITION:  She is discharged on a regular diet and no lifting 6  weeks, no driving 2 weeks, no shower with her drains in place.  She is  to empty  the drains at least 3 times a day and record the amounts and  remove the pain pump catheter tomorrow, and I have instructed her  husband how to do that.  Use spirometer 8 times a day at home.  She is  discharged on Percocet total of 30 given 2 p.o. q.6 p.r.n. pain, Keflex  250 mg p.o. q.i.d., and Robaxin 500 mg p.o. q.12.  I will see her back  in the office at the end of the next week.       Etter Sjogren, M.D.  Electronically Signed     DB/MEDQ  D:  07/06/2008  T:  07/07/2008  Job:  [id number]

## 2010-09-03 NOTE — Op Note (Signed)
NAMELAURALYN, SHADOWENS              ACCOUNT NO.:  [id number]   MEDICAL RECORD NO.:  [id number]          PATIENT TYPE:  INP   LOCATION:  5153                         FACILITY:  [facility]   PHYSICIAN:  Etter Sjogren, M.D.     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   POSTOPERATIVE DIAGNOSIS:  Left breast cancer acquired absence of the  breast.   PROCEDURES PERFORMED:  1. Left breast reconstruction with an ipsilateral transverse rectus      abdominis myocutaneous flap reconstruction.  2. Abdominal wall reconstruction with mesh.  3. Placement of On-Q pump catheter.   SURGEON:  Etter Sjogren, MD   ASSISTANT:  Loreta Ave, MD   ESTIMATED BLOOD LOSS:  200 mL.   DRAINS:  Four Blakes; two chest and two abdomen.   CLINICAL NOTE:  A 45 year old woman has had a mastectomy and has had a  previous delay of her TRAM flap.  She did prefer autogenous  reconstruction as opposed to implant.  The nature of the procedure and  the risks and possible complications were discussed with her and she  understood those very clearly and wished to proceed.   DESCRIPTION OF PROCEDURE:  The patient was marked in standing position  in the holding area for the TRAM reconstruction as well as the chest and  abdomen.  She was taken to the operating room and placed supine.  After  successful induction of general anesthesia, she was prepped with  Betadine and draped with sterile drapes.  The upper limb of the planned  TRAM incision was then made after first incising around the umbilicus  and dissecting free from front tissue leaving a de-generous fatty stalk,  especially on the right hand side or contralateral side of the  umbilicus.  The dissection was continued in a cephalad direction  beveling an upper direction to leave extra deep tissue  attachments to  the underlying muscle on the left side.  Dissection continued to the  xiphoid area on top of the muscle taking great care to avoid damage to  the underlying muscle.   The mastectomy defect was recreated excising the old mastectomy scar and  that was sent as a specimen and then developing flaps superior and  inferior on top of the muscle.  Meticulous hemostasis with the  electrocautery and moist flaps placed there.  A through-and-through  tunnel was then made in the medial aspect of this mastectomy defect and  this connected to the abdomen.   The lower limb of the incision was then made after first checking with  the patient placed in more of a beach-chair position to make sure that  the closure to be achieve.  She was returned in supine.  The lower limb  of the incision was made and the flap was elevated to the lateral row of  perforators from her left side, which was the ipsilateral side and all  the way to the midline on  the right side.  Parallel incision was made in  the anterior fascia 2.5 cm apart and this was continued down on either  side of the flap inferiorly and included the lateral row of perforators.  The muscle was then dissected free from its surrounding fascial  attachments using a combination of scalpel and bipolar cautery.  The  muscle had been freed completely the recipient site.  Left chest was  irrigated thoroughly with saline.  Meticulous hemostasis with  electrocautery.  Two Blake drains were positioned, brought through  separate stab wound inferolaterally and secured with 3-0 Prolene  sutures.  Attention was directed back to the abdomen where the muscle  was then divided very carefully.  The deep inferior gastric vessels were  identified and were triple ligated proximal and double ligated distal  towards the flap and divided.  The zone 4 was amputated with excellent  blood flow.  Bright red bleeding at this site was noted and cautery was   performed and then the flap was passed gently through the subcutaneous  tunnel to the left chest.  It was positioned such that zone 3 was  inferomedial and zone 2 was superolateral.  The flap was then temporally  secured with skin staples and attention was directed back to the  abdomen.   The abdomen was irrigated thoroughly with saline and the muscle was  noted good hemostasis meticulous with electrocautery and then the  fascial closure with 0 Prolene interrupted figure-of-eight sutures  taking great care to avoid damage to underlying intra-abdominal cavity.  She was irrigated again with saline and Onlay mesh was then prepared.  This was trimmed, appropriately applied, an opening placed for the  umbilicus and secured with 2-0 Prolene simple running suture.  A great  care was taken to avoid any pressure on the umbilicus in order to ensure  its viability.   Irrigated with saline again as hemostasis was achieved using  electrocautery and Blake drains were positioned one on each side brought  through separate stab wounds inferiorly and secured with 3-0 Prolene  sutures.  The On-Q pain catheter was then passed through the  subcutaneous space, it was positioned in a curled fashion around the  borders of the mesh Onlay in order to achieve better postoperative pain  control and was cold on the skin and secure with a Tegaderm.   The abdominal closure with 2-0 PDS simple interrupted inverted sutures  for the Scarpa layer followed by 3-0 Monocryl interrupted inverted deep  dermal sutures and running 3-0 Monocryl subcuticular suture.   An incision was then made for the umbilicus as close to the midline as  possible, midline had been marked preoperatively and the umbilicus  brought through this opening and it was inset with 3-0 Monocryl  interrupted inverted deep dermal sutures.  The umbilicus and the distal  abdominal skin had excellent color and appeared to be viable.   The flap was then  inspected.  It was found to have excellent color,  bright red bleeding around its periphery, consistent with viability.  Excellent capillary refill and the inferior age was inset 3-0 Monocryl  interrupted inverted deep dermal sutures.  The superior aspect was  trimmed, little bit lateral and a little bit inferomedial and de-  epithelialized and buried.  Suspension sutures of 3-0 Vicryl suture were  placed superiorly and the remaining flap inset 3-0 Monocryl interrupted  inverted deep dermal sutures with running 3-0 Monocryl subcuticular  suture.  Again, the flap continued to  have excellent color.  Steri-  Strips, dry sterile dressing applied.  She was transferred to the  recovery room in stable having tolerated the procedure very well.      Etter Sjogren, M.D.  Electronically Signed     DB/MEDQ  D:  07/04/2008  T:  07/05/2008  Job:  [id number]

## 2010-09-03 NOTE — Op Note (Signed)
NAMEKANNA, [name] [name]              ACCOUNT NO.:  [id number]   MEDICAL RECORD NO.:  [id number]          PATIENT TYPE:  AMB   LOCATION:  SDS                          FACILITY:  [facility]   PHYSICIAN:  Etter Sjogren, M.D.     DATE OF BIRTH:  01/02/1966   DATE OF PROCEDURE:  01/24/2008  DATE OF DISCHARGE:                               OPERATIVE REPORT   PREOPERATIVE DIAGNOSIS:  Left breast cancer.   POSTOPERATIVE DIAGNOSIS:  Left breast cancer.   PROCEDURE PERFORMED:  Bilateral delay of a transverse rectus abdominis  myocutaneous flap reconstruction, right and left side delay.   SURGEON:  Etter Sjogren, MD   ANESTHESIA:  General.   ESTIMATED BLOOD LOSS:  Minimal.   CLINICAL NOTE:  A 45 year old woman having mastectomy for breast cancer  and would like to have reconstruction.  Various options discussed and  she has elected using her own tissue from the stomach.  This procedure  was discussed with her in great detail as well as other options.  She  understood the risks and possible complications as well as the reasoning  behind a delayed procedure in order to better prepare the tissue for  eventual transfer.  All of this was discussed in great detail and having  understood the risks and possible complications including but not  limited to the bleeding, infection, anesthesia complications, healing  problems, scarring, fluid collections, loss of tissue, and she wished to  proceed.  She also understood there was risk to the abdominal wall as  well as hernia and damage to underlying intra-abdominal contents.  She  wished to proceed.   DESCRIPTION OF PROCEDURE:  The patient was marked in the holding area  for the delay incision, taken to the operating room and placed supine.  After successful induction of general anesthesia, she was prepped with  Betadine and draped with sterile drapes.  The incision was made.  Dissection carried out through the subcutaneous tissues with  electrocautery,  underlying fascia identified at the lateral border of  the rectus muscle bilaterally.  The fascia was opened with the direction  of the fibers.  The rectus muscle lateral edge on each side gently  retracted.  Deep inferior gastric vessels identified, and double and  triple ligated artery and veins and divided.  Meticulous hemostasis with  electrocautery and thorough irrigation with saline.  Next, hemostasis  having been confirmed, the closure of the fascia with 0 Prolene  interrupted figure-of-eight sutures, 0.25% Marcaine with epinephrine  infiltrated in the fascia surrounding the fascial incisions.  Blake  drain was positioned, brought out through a separate stab wound  inferolaterally and secured with a 3-0 Prolene suture, and the wound  again irrigated with saline.  Remainder of the closure was with 3-0  Monocryl interrupted buried deep dermal sutures and running 3-0 Monocryl  subcuticular suture.  Steri-Strips applied, tolerated well, and she will  be observed overnight.      Etter Sjogren, M.D.  Electronically Signed     DB/MEDQ  D:  01/24/2008  T:  01/24/2008  Job:  [id number]

## 2010-12-06 ENCOUNTER — Other Ambulatory Visit: Payer: Self-pay | Admitting: Oncology

## 2010-12-06 ENCOUNTER — Encounter (HOSPITAL_BASED_OUTPATIENT_CLINIC_OR_DEPARTMENT_OTHER): Payer: BC Managed Care – PPO | Admitting: Oncology

## 2010-12-06 DIAGNOSIS — C50319 Malignant neoplasm of lower-inner quadrant of unspecified female breast: Secondary | ICD-10-CM

## 2010-12-06 LAB — COMPREHENSIVE METABOLIC PANEL
Albumin: 3.7 g/dL (ref 3.5–5.2)
Alkaline Phosphatase: 65 U/L (ref 39–117)
BUN: 11 mg/dL (ref 6–23)
Creatinine, Ser: 1.01 mg/dL (ref 0.50–1.10)
Glucose, Bld: 111 mg/dL — ABNORMAL HIGH (ref 70–99)
Potassium: 3.6 mEq/L (ref 3.5–5.3)

## 2010-12-06 LAB — CBC WITH DIFFERENTIAL/PLATELET
BASO%: 0.3 % (ref 0.0–2.0)
LYMPH%: 17.5 % (ref 14.0–49.7)
MCHC: 33 g/dL (ref 31.5–36.0)
MONO#: 0.8 10*3/uL (ref 0.1–0.9)
RBC: 3.63 10*6/uL — ABNORMAL LOW (ref 3.70–5.45)
RDW: 15 % — ABNORMAL HIGH (ref 11.2–14.5)
WBC: 7.4 10*3/uL (ref 3.9–10.3)
lymph#: 1.3 10*3/uL (ref 0.9–3.3)

## 2010-12-07 LAB — VITAMIN D 25 HYDROXY (VIT D DEFICIENCY, FRACTURES): Vit D, 25-Hydroxy: 40 ng/mL (ref 30–89)

## 2011-01-21 LAB — COMPREHENSIVE METABOLIC PANEL
ALT: 40 — ABNORMAL HIGH
AST: 34
Albumin: 4.1
Calcium: 9
Chloride: 102
Creatinine, Ser: 0.81
GFR calc Af Amer: >60
Sodium: 136
Total Bilirubin: 0.8

## 2011-01-21 LAB — CANCER ANTIGEN 27.29: CA 27.29: 25

## 2011-01-21 LAB — CBC
HCT: 40.3
Hemoglobin: 13.8
WBC: 7.6

## 2011-01-21 LAB — URINALYSIS, ROUTINE W REFLEX MICROSCOPIC
Bilirubin Urine: NEGATIVE
Glucose, UA: NEGATIVE
Ketones, ur: NEGATIVE
pH: 6.5

## 2011-01-21 LAB — DIFFERENTIAL
Eosinophils Relative: 5
Lymphocytes Relative: 20
Lymphs Abs: 1.6
Monocytes Absolute: 0.8

## 2011-02-12 ENCOUNTER — Other Ambulatory Visit: Payer: Self-pay | Admitting: Oncology

## 2011-02-12 ENCOUNTER — Encounter (HOSPITAL_BASED_OUTPATIENT_CLINIC_OR_DEPARTMENT_OTHER): Payer: BC Managed Care – PPO | Admitting: Oncology

## 2011-02-12 DIAGNOSIS — C50319 Malignant neoplasm of lower-inner quadrant of unspecified female breast: Secondary | ICD-10-CM

## 2011-02-12 DIAGNOSIS — Z1231 Encounter for screening mammogram for malignant neoplasm of breast: Secondary | ICD-10-CM

## 2011-02-12 LAB — CBC WITH DIFFERENTIAL/PLATELET
BASO%: 0.5 % (ref 0.0–2.0)
Basophils Absolute: 0 10*3/uL (ref 0.0–0.1)
HCT: 28.8 % — ABNORMAL LOW (ref 34.8–46.6)
HGB: 9.3 g/dL — ABNORMAL LOW (ref 11.6–15.9)
MONO#: 0.8 10*3/uL (ref 0.1–0.9)
NEUT#: 4.2 10*3/uL (ref 1.5–6.5)
NEUT%: 62.1 % (ref 38.4–76.8)
WBC: 6.7 10*3/uL (ref 3.9–10.3)
lymph#: 1.2 10*3/uL (ref 0.9–3.3)

## 2011-02-12 LAB — COMPREHENSIVE METABOLIC PANEL
ALT: 16 U/L (ref 0–35)
BUN: 11 mg/dL (ref 6–23)
CO2: 23 mEq/L (ref 19–32)
Calcium: 8.7 mg/dL (ref 8.4–10.5)
Chloride: 104 mEq/L (ref 96–112)
Creatinine, Ser: 0.87 mg/dL (ref 0.50–1.10)

## 2011-02-13 LAB — FERRITIN: Ferritin: 4 ng/mL — ABNORMAL LOW (ref 10–291)

## 2011-02-13 LAB — IRON AND TIBC: TIBC: 403 ug/dL (ref 250–470)

## 2011-03-21 ENCOUNTER — Telehealth: Payer: Self-pay | Admitting: *Deleted

## 2011-03-21 NOTE — Telephone Encounter (Signed)
Pt called and stated she was told to see her GYN and that she possibly needed a total hysterectomy.  Pt is questioning if she should have hysterectomy or if an ablation would suffice for her anemia?  Will review with MD and notify patient.

## 2011-03-27 ENCOUNTER — Telehealth: Payer: Self-pay | Admitting: *Deleted

## 2011-03-27 NOTE — Telephone Encounter (Signed)
Pt. Is calling again as she has not heard back from her phone call on 03/21/11.   She has seen her GYN and would like Dr. Renelda Loma input.  She can have the ablation for her heavy periods/anemia,  But would it benefit her from a perspective of her hx of breast cancer to go ahead and have a hysterectomy including removal of her ovaries.   Call back is [phone].   Let her know that Dr. Donnie Coffin is out of the office and will return on Monday 03/31/11.

## 2011-05-28 ENCOUNTER — Encounter (HOSPITAL_COMMUNITY): Payer: Self-pay | Admitting: Pharmacist

## 2011-05-30 ENCOUNTER — Encounter (HOSPITAL_COMMUNITY): Payer: Self-pay

## 2011-05-30 ENCOUNTER — Encounter (HOSPITAL_COMMUNITY)
Admission: RE | Admit: 2011-05-30 | Discharge: 2011-05-30 | Disposition: A | Discharge status: Home / Self Care | Payer: BC Managed Care – PPO | Source: Ambulatory Visit | Attending: Obstetrics and Gynecology | Admitting: Obstetrics and Gynecology

## 2011-05-30 HISTORY — DX: Flushing: R23.2

## 2011-05-30 HISTORY — DX: Malignant neoplasm of unspecified site of unspecified female breast: C50.919

## 2011-05-30 HISTORY — DX: Anemia, unspecified: D64.9

## 2011-05-30 LAB — CBC
HCT: 40.1 % (ref 36.0–46.0)
Hemoglobin: 13.3 g/dL (ref 12.0–15.0)
MCH: 31.1 pg (ref 26.0–34.0)
MCHC: 33.2 g/dL (ref 30.0–36.0)
MCV: 93.9 fL (ref 78.0–100.0)

## 2011-05-30 LAB — SURGICAL PCR SCREEN: MRSA, PCR: NEGATIVE

## 2011-05-30 NOTE — Patient Instructions (Addendum)
   Your procedure is scheduled on: Monday, February 18 at 0730  Enter through the Main Entrance of [facility] at: 6:00 Pick up the phone at the desk and dial [phone] and inform us of your arrival.  Please call this number if you have any problems the morning of surgery: [phone]  Remember: Do not eat food after midnight: Sunday Do not drink clear liquids after: Sunday Take these medicines the morning of surgery with a SIP OF WATER: per anesthesia  Do not wear jewelry, make-up, or FINGER nail polish Do not wear lotions, powders, perfumes or deodorant. Do not shave 48 hours prior to surgery. Do not bring valuables to the hospital.  Leave suitcase in the car. After Surgery it may be brought to your room. For patients being admitted to the hospital, checkout time is 11:00am the day of discharge.  Remember to use your hibiclens as instructed.Please shower with 1/2 bottle the evening before your surgery and the other 1/2 bottle the morning of surgery.

## 2011-06-03 ENCOUNTER — Ambulatory Visit (HOSPITAL_BASED_OUTPATIENT_CLINIC_OR_DEPARTMENT_OTHER): Payer: BC Managed Care – PPO | Admitting: Oncology

## 2011-06-03 ENCOUNTER — Other Ambulatory Visit: Payer: BC Managed Care – PPO | Admitting: Lab

## 2011-06-03 ENCOUNTER — Telehealth: Payer: Self-pay | Admitting: Oncology

## 2011-06-03 DIAGNOSIS — C50919 Malignant neoplasm of unspecified site of unspecified female breast: Secondary | ICD-10-CM

## 2011-06-03 DIAGNOSIS — E559 Vitamin D deficiency, unspecified: Secondary | ICD-10-CM

## 2011-06-03 LAB — CBC WITH DIFFERENTIAL/PLATELET
Basophils Absolute: 0 10*3/uL (ref 0.0–0.1)
EOS%: 6.3 % (ref 0.0–7.0)
HCT: 38.7 % (ref 34.8–46.6)
HGB: 13.4 g/dL (ref 11.6–15.9)
LYMPH%: 24.8 % (ref 14.0–49.7)
MCH: 32.3 pg (ref 25.1–34.0)
MCV: 93.3 fL (ref 79.5–101.0)
MONO%: 10.1 % (ref 0.0–14.0)
NEUT%: 58.3 % (ref 38.4–76.8)
Platelets: 215 10*3/uL (ref 145–400)

## 2011-06-03 MED ORDER — ANASTROZOLE 1 MG PO TABS: 1.0000 mg | ORAL_TABLET | Freq: Every day | ORAL | Status: DC

## 2011-06-03 NOTE — Progress Notes (Signed)
Hematology and Oncology Follow Up Visit  [name] [name] [id number] 1966-04-03 46 y.o. 06/03/2011 9:16 AM PCP Dr Arelia Sneddon Dr Carolynne Edouard Principle Diagnosis: Stage IIA T2NO er/pr+ , her 2-ve breast cancer, s/p TC x 4, last cycle 05/19/08, s/p TRAM flap reconstruction to lt breast on tamoxifen 2. Hx of anemia.  Interim History:  There have been no intercurrent illness, hospitalizations or medication changes. She is due to [facility] next wk.  Medications: I have reviewed the patient's current medications.  Allergies: No Known Allergies  Past Medical History, Surgical history, Social history, and Family History were reviewed and updated.  Review of Systems: Constitutional:  Negative for fever, chills, night sweats, anorexia, weight loss, pain. Cardiovascular: no chest pain or dyspnea on exertion Respiratory: no cough, shortness of breath, or wheezing Neurological: no TIA or stroke symptoms Dermatological: negative ENT: negative Skin Gastrointestinal: no abdominal pain, change in bowel habits, or black or bloody stools Genito-Urinary: no dysuria, trouble voiding, or hematuria Hematological and Lymphatic: negative Breast: negative Musculoskeletal: negative Remaining ROS negative.  Physical Exam: Blood pressure 153/94, pulse 69, temperature 97.6 F (36.4 C), temperature source Oral, height 5\' 8"  (1.727 m), weight 176 lb 6.4 oz (80.015 kg). ECOG:  General appearance: alert, cooperative and appears stated age Head: Normocephalic, without obvious abnormality, atraumatic Neck: no adenopathy, no carotid bruit, no JVD, supple, symmetrical, trachea midline and thyroid not enlarged, symmetric, no tenderness/mass/nodules Lymph nodes: Cervical, supraclavicular, and axillary nodes normal. Cardiac : regular rate and rhythm Pulmonary:clear to auscultation bilaterally and normal percussion bilaterally Breasts: inspection negative, no nipple discharge or bleeding, no masses or nodularity palpable,. S/p  lt tram,  Abdomen:soft, non-tender; bowel sounds normal; no masses,  no organomegaly Extremities negative Neuro: alert, oriented, normal speech, no focal findings or movement disorder noted  Lab Results: Lab Results  Component Value Date   WBC 4.9 06/03/2011   HGB 13.4 06/03/2011   HCT 38.7 06/03/2011   MCV 93.3 06/03/2011   PLT 215 06/03/2011     Chemistry      Component Value Date/Time   NA 137 02/12/2011 1421   K 3.8 02/12/2011 1421   CL 104 02/12/2011 1421   CO2 23 02/12/2011 1421   BUN 11 02/12/2011 1421   CREATININE 0.87 02/12/2011 1421      Component Value Date/Time   CALCIUM 8.7 02/12/2011 1421   ALKPHOS 65 02/12/2011 1421   AST 20 02/12/2011 1421   ALT 16 02/12/2011 1421   BILITOT 0.2* 02/12/2011 1421      .pathology. Radiological Studies: chest X-ray n/a Mammogram n/a Bone density n/a  Impression and Plan: wandais doing well, she is due to [facility] hysterectomy next wk. We discussed transition to AI from tamoxifen and s/e of the above. I suggested that she begin in early march. Her hg is normakl and so she can stop her po iron. I will see her in 6  Months with f/u mammogram  More than 50% of the visit was spent in patient-related counselling   Pierce Crane, MD 2/12/20139:16 AM

## 2011-06-03 NOTE — Telephone Encounter (Signed)
gve the pt her aug 2013 appt calendar. Pt has her mammo appt already scheduled for july

## 2011-06-04 LAB — COMPREHENSIVE METABOLIC PANEL
AST: 24 U/L (ref 0–37)
Alkaline Phosphatase: 62 U/L (ref 39–117)
BUN: 13 mg/dL (ref 6–23)
Creatinine, Ser: 0.87 mg/dL (ref 0.50–1.10)
Glucose, Bld: 102 mg/dL — ABNORMAL HIGH (ref 70–99)

## 2011-06-05 NOTE — H&P (Signed)
  Patient name  Ouita, Nish DICTATION#  [id number] CSN# [id number]  [name], MD 06/05/2011 12:46 PM

## 2011-06-06 NOTE — H&P (Signed)
  Patient at pre-op reports problem with incontinence.  Reports leaking small amounts of urine with exercise, coughing or sneezing.  No symptoms of over active bladder.  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  Have not done bladder studies.  Discussed delaying the surgery to get studies done of proceeding with TOT without them. She desires TOT.  Explained without studies this may not help or could make things worse.  Risk explained including:  Injury to bladder bowel or ureters that could require further surgery;  obturator nerve injury with leg pain and weakness;  Mesh errosion into bladder urethrae or vaginal requiring removal; mesh rejection leading to pelvic pain and dysparunia; development of bladder spasms requiring medical therapy;  overtightening of the sling leading to obstructive voiding pattern requiring taking dowm or lossening the sling with return of incontinence.  Patinet expresses understanding of risks.  Success rate of 80% quoted.

## 2011-06-06 NOTE — H&P (Signed)
NAME:  [name] [name]                     ACCOUNT NO.:  MEDICAL RECORD NO.:  [id number]  LOCATION:                                 FACILITY:  PHYSICIAN:  [name], M.D.   DATE OF BIRTH:  03-06-1966  DATE OF ADMISSION: DATE OF DISCHARGE:                             HISTORY & PHYSICAL   DATE OF SURGERY:  June 09, 2011 at [facility].  HISTORY OF PRESENT ILLNESS:  The patient is a 46 year old, gravida 2, para 2 female, who presents for laparoscopic-assisted vaginal hysterectomy and bilateral salpingo-oophorectomy.  In relation to present admission, the patient has been having trouble with increasing irregular periods with heavy flow.  She is on tamoxifen for history of breast cancer and does have resultant anemia.  We had done a previous saline infusion and ultrasound did reveal a polyp.  She underwent hysteroscopic resection.  Pathology was benign, but she has continued to have very heavy irregular cycling and decided to proceed with more aggressive therapy.  Alternatives have been discussed including the possibility of endometrial ablation versus Mirena IUD versus hysterectomy.  The patient is in favor of the latter and would like to have the ovaries removed in view of her personal history of breast cancer.  ALLERGIES:  No known drug allergies.  MEDICATIONS:  Tamoxifen, Effexor, and iron.  PAST MEDICAL HISTORY:  She has a history of breast cancer diagnosed in 2010.  Otherwise, usual childhood diseases.  PAST SURGICAL HISTORY:  This included an appendectomy.  A previous cesarean section.  Lasix surgery.  Left mastectomy with reconstruction using a TRAM flap, and previously noted hysteroscopy with D and C.  FAMILY HISTORY:  Noncontributory.  SOCIAL HISTORY:  No tobacco or alcohol use.  REVIEW OF SYSTEMS:  Noncontributory.  PHYSICAL EXAMINATION:  VITAL SIGNS:  The patient is afebrile with stable vital signs. HEENT:  The patient is normocephalic.  Pupils  equal, round, and react to light and accommodation.  Extraocular movements are intact.  Sclerae and conjunctivae are clear.  Oropharynx is clear. BREASTS:  The patient has had a left previous mastectomy with TRAM flap. There are no other abnormalities noted.  Right breast is unremarkable with no mass, adenopathy, or nipple discharge. LUNGS:  Clear. CARDIOVASCULAR SYSTEM:  Regular rhythm and rate without murmurs or gallops. ABDOMEN:  Benign.  No mass, organomegaly, or tenderness.  Does have a transverse incision and periumbilical incision from TRAM flap. PELVIC:  Normal external genitalia.  Vaginal mucosa is clear.  Cervix unremarkable.  Uterus of normal size, shape, and contour.  Adnexa free of mass or tenderness. EXTREMITIES:  Trace edema. NEUROLOGIC:  Grossly within normal limits.  IMPRESSION: 1. Abnormal uterine bleeding with associated menorrhagia and anemia. 2. History of breast cancer, presently on tamoxifen.  PLAN:  The patient will undergo laparoscopic-assisted vaginal hysterectomy with bilateral salpingo-oophorectomy.  Alternatives have been discussed.  The risks of surgery have been explained including the risk of infection.  Risk of hemorrhage that could require transfusion with the risk of AIDS, hepatitis.  Risk of injury to adjacent organs including bladder, bowel, ureters that could require further exploratory surgery.  Risk of deep venous thrombosis and  pulmonary embolus. Potential onset of menopausal symptomatology after ovary removal have been discussed.  Limitations in terms of affected treatment via breast cancer have been explained.  The patient does understand indications and risks.     [name], M.D.     JSM/MEDQ  D:  06/05/2011  T:  06/06/2011  Job:  [id number]

## 2011-06-09 ENCOUNTER — Ambulatory Visit (HOSPITAL_COMMUNITY): Payer: BC Managed Care – PPO | Admitting: Anesthesiology

## 2011-06-09 ENCOUNTER — Encounter (HOSPITAL_COMMUNITY): Payer: Self-pay | Admitting: *Deleted

## 2011-06-09 ENCOUNTER — Other Ambulatory Visit: Payer: Self-pay | Admitting: Obstetrics and Gynecology

## 2011-06-09 ENCOUNTER — Encounter (HOSPITAL_COMMUNITY): Payer: Self-pay | Admitting: Anesthesiology

## 2011-06-09 ENCOUNTER — Ambulatory Visit (HOSPITAL_COMMUNITY)
Admission: RE | Admit: 2011-06-09 | Discharge: 2011-06-10 | Disposition: A | Discharge status: Home / Self Care | Payer: BC Managed Care – PPO | Source: Ambulatory Visit | Attending: Obstetrics and Gynecology | Admitting: Obstetrics and Gynecology

## 2011-06-09 ENCOUNTER — Encounter (HOSPITAL_COMMUNITY)
Admission: RE | Disposition: A | Discharge status: Home / Self Care | Payer: Self-pay | Source: Ambulatory Visit | Attending: Obstetrics and Gynecology

## 2011-06-09 DIAGNOSIS — Z90722 Acquired absence of ovaries, bilateral: Secondary | ICD-10-CM

## 2011-06-09 DIAGNOSIS — N8000 Endometriosis of the uterus, unspecified: Secondary | ICD-10-CM | POA: Insufficient documentation

## 2011-06-09 DIAGNOSIS — N920 Excessive and frequent menstruation with regular cycle: Secondary | ICD-10-CM

## 2011-06-09 DIAGNOSIS — Z01812 Encounter for preprocedural laboratory examination: Secondary | ICD-10-CM | POA: Insufficient documentation

## 2011-06-09 DIAGNOSIS — Z853 Personal history of malignant neoplasm of breast: Secondary | ICD-10-CM | POA: Insufficient documentation

## 2011-06-09 DIAGNOSIS — Z90710 Acquired absence of both cervix and uterus: Secondary | ICD-10-CM

## 2011-06-09 DIAGNOSIS — N393 Stress incontinence (female) (male): Secondary | ICD-10-CM | POA: Insufficient documentation

## 2011-06-09 HISTORY — PX: CYSTOSCOPY: SHX5120

## 2011-06-09 HISTORY — PX: BLADDER SUSPENSION: SHX72

## 2011-06-09 HISTORY — PX: SALPINGOOPHORECTOMY: SHX82

## 2011-06-09 HISTORY — PX: LAPAROSCOPIC ASSISTED VAGINAL HYSTERECTOMY: SHX5398

## 2011-06-09 SURGERY — HYSTERECTOMY, VAGINAL, LAPAROSCOPY-ASSISTED
Anesthesia: General | Site: Vagina | Wound class: Clean Contaminated

## 2011-06-09 MED ORDER — NEOSTIGMINE METHYLSULFATE 1 MG/ML IJ SOLN
INTRAMUSCULAR | Status: AC
  Filled 2011-06-09: qty 10

## 2011-06-09 MED ORDER — SCOPOLAMINE 1 MG/3DAYS TD PT72
1.0000 | MEDICATED_PATCH | Freq: Once | TRANSDERMAL | Status: DC
  Administered 2011-06-09: 1.5 mg via TRANSDERMAL

## 2011-06-09 MED ORDER — KETOROLAC TROMETHAMINE 30 MG/ML IJ SOLN: 15.0000 mg | Freq: Once | INTRAMUSCULAR | Status: DC | PRN

## 2011-06-09 MED ORDER — MIDAZOLAM HCL 2 MG/2ML IJ SOLN
INTRAMUSCULAR | Status: AC
  Filled 2011-06-09: qty 2

## 2011-06-09 MED ORDER — CEFAZOLIN SODIUM 1-5 GM-% IV SOLN
1.0000 g | INTRAVENOUS | Status: AC
  Administered 2011-06-09: 1 g via INTRAVENOUS

## 2011-06-09 MED ORDER — BUPIVACAINE HCL (PF) 0.25 % IJ SOLN
INTRAMUSCULAR | Status: DC | PRN
  Administered 2011-06-09: 6 mL

## 2011-06-09 MED ORDER — CEFAZOLIN SODIUM 1-5 GM-% IV SOLN
INTRAVENOUS | Status: AC
  Filled 2011-06-09: qty 50

## 2011-06-09 MED ORDER — ONDANSETRON HCL 4 MG/2ML IJ SOLN
INTRAMUSCULAR | Status: DC | PRN
  Administered 2011-06-09: 4 mg via INTRAVENOUS

## 2011-06-09 MED ORDER — FENTANYL CITRATE 0.05 MG/ML IJ SOLN
INTRAMUSCULAR | Status: DC | PRN
  Administered 2011-06-09: 100 ug via INTRAVENOUS
  Administered 2011-06-09: 150 ug via INTRAVENOUS
  Administered 2011-06-09: 100 ug via INTRAVENOUS
  Administered 2011-06-09: 150 ug via INTRAVENOUS

## 2011-06-09 MED ORDER — FENTANYL CITRATE 0.05 MG/ML IJ SOLN
INTRAMUSCULAR | Status: AC
  Filled 2011-06-09: qty 5

## 2011-06-09 MED ORDER — BUPIVACAINE-EPINEPHRINE (PF) 0.5% -1:200000 IJ SOLN
INTRAMUSCULAR | Status: AC
  Filled 2011-06-09: qty 10

## 2011-06-09 MED ORDER — ONDANSETRON HCL 4 MG PO TABS: 4.0000 mg | ORAL_TABLET | Freq: Four times a day (QID) | ORAL | Status: DC | PRN

## 2011-06-09 MED ORDER — DEXAMETHASONE SODIUM PHOSPHATE 4 MG/ML IJ SOLN
INTRAMUSCULAR | Status: DC | PRN
  Administered 2011-06-09: 10 mg via INTRAVENOUS

## 2011-06-09 MED ORDER — HYDROMORPHONE HCL PF 1 MG/ML IJ SOLN
INTRAMUSCULAR | Status: DC | PRN
  Administered 2011-06-09: 1 mg via INTRAVENOUS

## 2011-06-09 MED ORDER — PROPOFOL 10 MG/ML IV EMUL
INTRAVENOUS | Status: AC
  Filled 2011-06-09: qty 20

## 2011-06-09 MED ORDER — GLYCOPYRROLATE 0.2 MG/ML IJ SOLN
INTRAMUSCULAR | Status: DC | PRN
  Administered 2011-06-09: .3 mg via INTRAVENOUS

## 2011-06-09 MED ORDER — MIDAZOLAM HCL 5 MG/5ML IJ SOLN
INTRAMUSCULAR | Status: DC | PRN
  Administered 2011-06-09: 2 mg via INTRAVENOUS

## 2011-06-09 MED ORDER — ONDANSETRON HCL 4 MG/2ML IJ SOLN: 4.0000 mg | Freq: Four times a day (QID) | INTRAMUSCULAR | Status: DC | PRN

## 2011-06-09 MED ORDER — INDIGOTINDISULFONATE SODIUM 8 MG/ML IJ SOLN
INTRAMUSCULAR | Status: AC
  Filled 2011-06-09: qty 5

## 2011-06-09 MED ORDER — LACTATED RINGERS IR SOLN
Status: DC | PRN
  Administered 2011-06-09: 3000 mL

## 2011-06-09 MED ORDER — SODIUM CHLORIDE 0.9 % IJ SOLN: 9.0000 mL | INTRAMUSCULAR | Status: DC | PRN

## 2011-06-09 MED ORDER — ROCURONIUM BROMIDE 50 MG/5ML IV SOLN
INTRAVENOUS | Status: AC
  Filled 2011-06-09: qty 2

## 2011-06-09 MED ORDER — MENTHOL 3 MG MT LOZG: 1.0000 | LOZENGE | OROMUCOSAL | Status: DC | PRN

## 2011-06-09 MED ORDER — HYDROMORPHONE HCL PF 1 MG/ML IJ SOLN
INTRAMUSCULAR | Status: AC
  Filled 2011-06-09: qty 1

## 2011-06-09 MED ORDER — VENLAFAXINE HCL 37.5 MG PO TABS
37.5000 mg | ORAL_TABLET | Freq: Every day | ORAL | Status: DC
Start: 2011-06-09 — End: 2011-06-10
  Administered 2011-06-09: 37.5 mg via ORAL
  Filled 2011-06-09 (×2): qty 1

## 2011-06-09 MED ORDER — DIPHENHYDRAMINE HCL 12.5 MG/5ML PO ELIX: 12.5000 mg | ORAL_SOLUTION | Freq: Four times a day (QID) | ORAL | Status: DC | PRN

## 2011-06-09 MED ORDER — NALOXONE HCL 0.4 MG/ML IJ SOLN: 0.4000 mg | INTRAMUSCULAR | Status: DC | PRN

## 2011-06-09 MED ORDER — KETOROLAC TROMETHAMINE 30 MG/ML IJ SOLN
INTRAMUSCULAR | Status: AC
  Filled 2011-06-09: qty 1

## 2011-06-09 MED ORDER — HYDROMORPHONE 0.3 MG/ML IV SOLN
INTRAVENOUS | Status: DC
  Administered 2011-06-09: 11:00:00 via INTRAVENOUS

## 2011-06-09 MED ORDER — LIDOCAINE HCL (CARDIAC) 20 MG/ML IV SOLN
INTRAVENOUS | Status: DC | PRN
  Administered 2011-06-09: 60 mg via INTRAVENOUS
  Administered 2011-06-09: 10 mg via INTRAVENOUS

## 2011-06-09 MED ORDER — LIDOCAINE-EPINEPHRINE (PF) 1 %-1:200000 IJ SOLN
INTRAMUSCULAR | Status: DC | PRN
  Administered 2011-06-09: 20 mL

## 2011-06-09 MED ORDER — FENTANYL CITRATE 0.05 MG/ML IJ SOLN: 25.0000 ug | INTRAMUSCULAR | Status: DC | PRN

## 2011-06-09 MED ORDER — PROPOFOL 10 MG/ML IV EMUL
INTRAVENOUS | Status: DC | PRN
  Administered 2011-06-09: 150 mg via INTRAVENOUS

## 2011-06-09 MED ORDER — ROCURONIUM BROMIDE 100 MG/10ML IV SOLN
INTRAVENOUS | Status: DC | PRN
  Administered 2011-06-09: 45 mg via INTRAVENOUS
  Administered 2011-06-09: 5 mg via INTRAVENOUS

## 2011-06-09 MED ORDER — ACETAMINOPHEN 325 MG PO TABS: 325.0000 mg | ORAL_TABLET | ORAL | Status: DC | PRN

## 2011-06-09 MED ORDER — ZOLPIDEM TARTRATE 5 MG PO TABS: 5.0000 mg | ORAL_TABLET | Freq: Every evening | ORAL | Status: DC | PRN

## 2011-06-09 MED ORDER — OXYCODONE-ACETAMINOPHEN 5-325 MG PO TABS: 1.0000 | ORAL_TABLET | ORAL | Status: DC | PRN

## 2011-06-09 MED ORDER — PROMETHAZINE HCL 25 MG/ML IJ SOLN: 6.2500 mg | INTRAMUSCULAR | Status: DC | PRN

## 2011-06-09 MED ORDER — HYDROMORPHONE 0.3 MG/ML IV SOLN
INTRAVENOUS | Status: AC
  Filled 2011-06-09: qty 25

## 2011-06-09 MED ORDER — GLYCOPYRROLATE 0.2 MG/ML IJ SOLN
INTRAMUSCULAR | Status: AC
  Filled 2011-06-09: qty 2

## 2011-06-09 MED ORDER — DEXAMETHASONE SODIUM PHOSPHATE 10 MG/ML IJ SOLN
INTRAMUSCULAR | Status: AC
  Filled 2011-06-09: qty 1

## 2011-06-09 MED ORDER — ACETAMINOPHEN 325 MG PO TABS: 650.0000 mg | ORAL_TABLET | ORAL | Status: DC | PRN

## 2011-06-09 MED ORDER — KETOROLAC TROMETHAMINE 30 MG/ML IJ SOLN
INTRAMUSCULAR | Status: DC | PRN
  Administered 2011-06-09: 30 mg via INTRAVENOUS

## 2011-06-09 MED ORDER — LIDOCAINE HCL (CARDIAC) 20 MG/ML IV SOLN
INTRAVENOUS | Status: AC
  Filled 2011-06-09: qty 5

## 2011-06-09 MED ORDER — LIDOCAINE-EPINEPHRINE (PF) 1 %-1:200000 IJ SOLN
INTRAMUSCULAR | Status: AC
  Filled 2011-06-09: qty 10

## 2011-06-09 MED ORDER — NEOSTIGMINE METHYLSULFATE 1 MG/ML IJ SOLN
INTRAMUSCULAR | Status: DC | PRN
  Administered 2011-06-09: 2 mg via INTRAVENOUS

## 2011-06-09 MED ORDER — LACTATED RINGERS IV SOLN
INTRAVENOUS | Status: DC
  Administered 2011-06-09 (×2): via INTRAVENOUS

## 2011-06-09 MED ORDER — BUPIVACAINE HCL (PF) 0.25 % IJ SOLN
INTRAMUSCULAR | Status: AC
  Filled 2011-06-09: qty 30

## 2011-06-09 MED ORDER — STERILE WATER FOR IRRIGATION IR SOLN
Status: DC | PRN
  Administered 2011-06-09: 400 mL

## 2011-06-09 MED ORDER — LACTATED RINGERS IV SOLN
INTRAVENOUS | Status: DC
  Administered 2011-06-09: 21:00:00 via INTRAVENOUS
  Administered 2011-06-09: 125 mL/h via INTRAVENOUS

## 2011-06-09 MED ORDER — SCOPOLAMINE 1 MG/3DAYS TD PT72
MEDICATED_PATCH | TRANSDERMAL | Status: AC
  Administered 2011-06-09: 1.5 mg via TRANSDERMAL
  Filled 2011-06-09: qty 1

## 2011-06-09 MED ORDER — DIPHENHYDRAMINE HCL 50 MG/ML IJ SOLN: 12.5000 mg | Freq: Four times a day (QID) | INTRAMUSCULAR | Status: DC | PRN

## 2011-06-09 SURGICAL SUPPLY — 56 items
ADH SKN CLS APL DERMABOND .7 (GAUZE/BANDAGES/DRESSINGS) ×6
BLADE SURG 15 STRL LF C SS BP (BLADE) ×3 IMPLANT
BLADE SURG 15 STRL SS (BLADE) ×4
CABLE HIGH FREQUENCY MONO STRZ (ELECTRODE) IMPLANT
CANISTER SUCTION 2500CC (MISCELLANEOUS) ×4 IMPLANT
CATH ROBINSON RED A/P 16FR (CATHETERS) IMPLANT
CATH SILICONE 16FRX5CC (CATHETERS) ×1 IMPLANT
CLOTH BEACON ORANGE TIMEOUT ST (SAFETY) ×4 IMPLANT
CONT PATH 16OZ SNAP LID 3702 (MISCELLANEOUS) ×2 IMPLANT
CONT SPEC PATH 64OZ SNAP LID (MISCELLANEOUS) ×2 IMPLANT
COVER TABLE BACK 60X90 (DRAPES) ×4 IMPLANT
DECANTER SPIKE VIAL GLASS SM (MISCELLANEOUS) ×1 IMPLANT
DERMABOND ADVANCED (GAUZE/BANDAGES/DRESSINGS) ×2
DERMABOND ADVANCED .7 DNX12 (GAUZE/BANDAGES/DRESSINGS) ×4 IMPLANT
DRAPE CAMERA CLOSED 9X96 (DRAPES) ×4 IMPLANT
DRAPE HYSTEROSCOPY (DRAPE) ×4 IMPLANT
DRSG COVADERM 4X6 (GAUZE/BANDAGES/DRESSINGS) ×1 IMPLANT
ELECT REM PT RETURN 9FT ADLT (ELECTROSURGICAL) ×4
ELECTRODE REM PT RTRN 9FT ADLT (ELECTROSURGICAL) IMPLANT
GAUZE SPONGE 4X4 16PLY XRAY LF (GAUZE/BANDAGES/DRESSINGS) ×1 IMPLANT
GLOVE BIO SURGEON STRL SZ7 (GLOVE) ×14 IMPLANT
GLOVE BIOGEL PI IND STRL 6.5 (GLOVE) ×3 IMPLANT
GLOVE BIOGEL PI INDICATOR 6.5 (GLOVE) ×7
GLOVE ECLIPSE 6.5 STRL STRAW (GLOVE) ×1 IMPLANT
GOWN PREVENTION PLUS LG XLONG (DISPOSABLE) ×16 IMPLANT
GOWN SURGICAL XLG (GOWNS) ×1 IMPLANT
NDL INSUFFLATION 14GA 120MM (NEEDLE) IMPLANT
NEEDLE HYPO 22GX1.5 SAFETY (NEEDLE) ×6 IMPLANT
NEEDLE INSUFFLATION 14GA 120MM (NEEDLE) ×4 IMPLANT
NS IRRIG 1000ML POUR BTL (IV SOLUTION) ×4 IMPLANT
PACK LAVH (CUSTOM PROCEDURE TRAY) ×4 IMPLANT
PACK VAGINAL WOMENS (CUSTOM PROCEDURE TRAY) ×4 IMPLANT
PROTECTOR NERVE ULNAR (MISCELLANEOUS) ×5 IMPLANT
SEALER TISSUE G2 CVD JAW 45CM (ENDOMECHANICALS) ×4 IMPLANT
SET CYSTO W/LG BORE CLAMP LF (SET/KITS/TRAYS/PACK) ×4 IMPLANT
SET IRRIG TUBING LAPAROSCOPIC (IRRIGATION / IRRIGATOR) ×1 IMPLANT
SLING HALO OBTRYX (Sling) ×4 IMPLANT
SLING HALO OBTRYX NDL (Sling) IMPLANT
STRIP CLOSURE SKIN 1/4X3 (GAUZE/BANDAGES/DRESSINGS) IMPLANT
SUT MON AB 2-0 CT1 27 (SUTURE) ×5 IMPLANT
SUT VIC AB 0 CT1 18XCR BRD8 (SUTURE) ×7 IMPLANT
SUT VIC AB 0 CT1 27 (SUTURE) ×4
SUT VIC AB 0 CT1 27XBRD ANBCTR (SUTURE) ×3 IMPLANT
SUT VIC AB 0 CT1 36 (SUTURE) ×8 IMPLANT
SUT VIC AB 0 CT1 8-18 (SUTURE) ×12
SUT VIC AB 2-0 UR6 27 (SUTURE) ×9 IMPLANT
SUT VICRYL 0 UR6 27IN ABS (SUTURE) ×8 IMPLANT
SUT VICRYL 1 TIES 12X18 (SUTURE) ×4 IMPLANT
SUT VICRYL 4-0 PS2 18IN ABS (SUTURE) ×4 IMPLANT
TOWEL OR 17X24 6PK STRL BLUE (TOWEL DISPOSABLE) ×8 IMPLANT
TRAY FOLEY CATH 14FR (SET/KITS/TRAYS/PACK) ×4 IMPLANT
TROCAR BALLN 12MMX100 BLUNT (TROCAR) ×4 IMPLANT
TROCAR Z-THREAD BLADED 11X100M (TROCAR) IMPLANT
TROCAR Z-THREAD BLADED 5X100MM (TROCAR) ×4 IMPLANT
WARMER LAPAROSCOPE (MISCELLANEOUS) ×4 IMPLANT
WATER STERILE IRR 1000ML POUR (IV SOLUTION) ×4 IMPLANT

## 2011-06-09 NOTE — Progress Notes (Signed)
Patient ID: [name] [name], female   DOB: April 18, 1966, 46 y.o.   MRN: [id number] Patient name DICTATION#  [id number] CSN# [id number]  [name], MD 06/09/2011 9:42 AM

## 2011-06-09 NOTE — Brief Op Note (Signed)
06/09/2011  9:40 AM  PATIENT:  Scherrie November  46 y.o. female  PRE-OPERATIVE DIAGNOSIS:  menorrhagia, history of breast cancer  POST-OPERATIVE DIAGNOSIS:  menorrhagia, history of breast cancer  PROCEDURE:  Procedure(s) (LRB): LAPAROSCOPIC ASSISTED VAGINAL HYSTERECTOMY (N/A) SALPINGO OOPHERECTOMY (Bilateral) TRANSVAGINAL TAPE (TVT) PROCEDURE (N/A) CYSTOSCOPY (N/A)  SURGEON:  Surgeon(s) and Role:    * [name], MD - Primary    * Zelphia Cairo, MD - Assisting  PHYSICIAN ASSISTANT:   ASSISTANTS: atkins   ANESTHESIA:   general  EBL:  Total I/O In: -  Out: 900 [Urine:500; Blood:400]  BLOOD ADMINISTERED:none  DRAINS: Urinary Catheter (Foley)   LOCAL MEDICATIONS USED:  MARCAINE    and XYLOCAINE   SPECIMEN:  Source of Specimen:  uterus tubes and ovaries  DISPOSITION OF SPECIMEN:  PATHOLOGY  COUNTS:  YES  TOURNIQUET:  * No tourniquets in log *  DICTATION: .Other Dictation: Dictation Number [id number]  PLAN OF CARE: Admit for overnight observation  PATIENT DISPOSITION:  PACU - hemodynamically stable.   Delay start of Pharmacological VTE agent (>24hrs) due to surgical blood loss or risk of bleeding: no

## 2011-06-09 NOTE — Anesthesia Procedure Notes (Signed)
Procedure Name: Intubation Date/Time: 06/09/2011 7:24 AM Performed by: Isabella Bowens Pre-anesthesia Checklist: Patient identified, Emergency Drugs available, Suction available, Patient being monitored and Timeout performed Patient Re-evaluated:Patient Re-evaluated prior to inductionOxygen Delivery Method: Circle System Utilized Preoxygenation: Pre-oxygenation with 100% oxygen Intubation Type: IV induction Ventilation: Mask ventilation without difficulty Laryngoscope Size: Mac and 3 Grade View: Grade II Tube type: Oral Tube size: 7.0 mm Number of attempts: 2 Airway Equipment and Method: stylet Placement Confirmation: ETT inserted through vocal cords under direct vision,  positive ETCO2 and breath sounds checked- equal and bilateral Secured at: 21 cm Tube secured with: Tape Dental Injury: Teeth and Oropharynx as per pre-operative assessment  Difficulty Due To: Difficulty was unanticipated

## 2011-06-09 NOTE — Op Note (Signed)
NAMEDESTANIE, TIBBETTS                ACCOUNT NO.:  [id number]  MEDICAL RECORD NO.:  [id number]  LOCATION:  [facility]                          FACILITY:  [facility]  PHYSICIAN:  [name], M.D.   DATE OF BIRTH:  01/03/1966  DATE OF PROCEDURE:  06/09/2011 DATE OF DISCHARGE:                              OPERATIVE REPORT   PREOPERATIVE DIAGNOSES: 1. Menorrhagia secondary to adenomyosis. 2. Personal history of breast cancer. 3. Stress urinary incontinence.  POSTOPERATIVE DIAGNOSES: 1. Menorrhagia secondary to adenomyosis. 2. Personal history of breast cancer. 3. Stress urinary incontinence.  OPERATIVE PROCEDURES: 1. Laparoscopic-assisted vaginal hysterectomy with bilateral salpingo-     oophorectomy. 2. Mid urethral sling using an obturator approach. 3. Cystoscopy.  SURGEON:  [name], MD.  ASSISTANT:  Zelphia Cairo, MD.  ANESTHESIA:  General endotracheal.  ESTIMATED BLOOD LOSS:  500 mL.  PACKS:  None.  DRAINS:  Include a urethral Foley.  INTRAOPERATIVE BLOOD PLACED:  None.  COMPLICATIONS:  None.  INDICATION:  Dictated history and physical.  PROCEDURE IN DETAIL:  The patient was taken to the OR and placed in supine position.  After a satisfactory level of general endotracheal anesthesia was obtained, the patient was placed in the dorsal lithotomy position using the Allen stirrups.  The abdomen, perineum, and vagina were prepped out with Betadine.  Bladder was emptied by catheterization. A Hulka tenaculum was put in place and secured.  The patient was draped in a sterile field.  A subumbilical incision was made with a knife, carried through the subcutaneous tissue.  Fascia was entered sharply and incision was fashioned laterally.  Peritoneum was entered with a blunt finger pressure.  The open laparoscopic trocar was put in place, secured, and the abdomen was inflated with carbon dioxide.  Laparoscope was introduced.  There was no evidence of injury to the adjacent  organs. Upper abdomen, liver tip, gallbladder were clear.  Appendix was surgically absent.  Both lateral gutters were clear.  A 5-mm trocar was put in place in the suprapubic area under direct visualization.  Uterus was elevated.  First, the right tube and ovary were elevated.  The course of the ureter was easily identified.  Using the Enseal, we cauterized and incised the ovarian vasculature.  We then separated the tube and ovary from its mesenteric attachments using the Enseal cautery incision.  Next, the round ligament was cauterized and incised.  We then went to the left side.  The left tube and ovary was elevated.  Again, the course of the ureter was easily followed along the pelvic sidewall. Using the Enseal, left ovarian vasculature was cauterized and incised. The mesenteric attachments of the tubes and ovary were cauterized, incised up to the round ligament, then the round ligament was cauterized and incised.  We had good release of the adnexa and good hemostasis.  At this point in time, the abdomen was deflated of carbon dioxide.  The laparoscope was removed.  The Hulka tenaculum was then removed.  The patient's legs were repositioned.  A weighted speculum was placed in the vaginal vault. Cervix was grasped with a Christella Hartigan tenaculum.  Cul-de-sac was entered sharply.  Both the uterosacral ligaments were clamped,  cut, and suture ligated with 0 Vicryl.  The reflection of vaginal mucosa anteriorly was incised and the bladder was dissected superiorly.  Paracervical tissue was then clamped, cut, and suture ligated with 0 Vicryl.  Using clamp, cut, tie technique with suture ligature of 0 Vicryl, parametrium was serially separated from the uterus.  It was difficult to deliver the whole uterus, therefore decided proceed with morcellation.  We had entered the vesicouterine space at this point in time.  First, the cervix and lower uterine segment was then excised.  Then grasping segments of  the uterine fundus, this was morcellated.  Eventually, we were able to flip the uterus.  Remaining pedicles were clamped and cut, and the remaining fundus, tubes, and ovaries were passed off the operative field.  Held pedicles were secured with free ties of 0 Vicryl. Areas of bleeding brought under control with figure-of-8 of 0 Vicryl. The posterior vaginal cuff was run with a running locking suture of 0 Vicryl.  Uterosacral plication stitch of 0 Vicryl was put in place and secured.  Vaginal mucosa was reapproximated with interrupted sutures of 2-0 Monocryl.  Foley was placed to straight drain , obtained 100 mL of clear urine.  A mid urethral area was identified, infiltrated with 1% Xylocaine with epinephrine.  We also infiltrated out laterally toward the obturator foramen.  Using a knife, an incision was made in the vaginal mucosa over the mid urethra.  We were able to dissect out laterally on each side using blunt and sharp dissection until we got to the obturator foramen. An area on the peritoneum was identified at the level of the clitoris inferior to the abductus longus muscle tendon and lateral to the inferior pubic ramus.  This area was infiltrated.  Incision was made using a knife.  We then brought the obturator system in place.  Both needles were passed through the skin incision through the obturator foramen around the inferior pubic ramus and out to the vaginal incision on each side.  There was no evidence that we buttonholed the vaginal mucosa.  At this point in time, cystoscopy was performed.  There was no evidence of injury to the bladder or the urethra.  The patient had been given indigo carmine.  Streams of blue urine were noted coming from both ureteral orifices.  The cystoscope was then removed.  The polypropylene mesh was brought in place, secured to the needle, and brought out through the skin.  The plastic tab was trimmed.  The skin was adjusted under mid urethral  until it lay flat, but we could easily rotate a Kelly to 90 degrees.  At this time, the plastic sleeves have been removed.  We readjusted the mesh so that it was not too tight.  Arms of the mesh were trimmed.  The vaginal mucosa was then closed in a running locking suture of 0 Vicryl.  Skin was closed with Dermabond.  At this point in time, laparoscope was reintroduced.  The abdomen was inflated with carbon dioxide.  We irrigated the pelvis, had some oozing from the vaginal cuff, brought under control with the bipolar.  Both ovarian sites were hemostatically intact.  We thoroughly irrigated the pelvis, had good hemostasis.  We deflated and reinflated, no active bleeding was encountered.  The abdomen was deflated of carbon dioxide. All trocars were removed.  Subumbilical fascia was closed with figure-of- eight of 0 Vicryl.  Skin was closed with interrupted subcuticulars of 4-0 Vicryl.  Suprapubic incision was closed with Dermabond.  The patient was taken out of the dorsal lithotomy position.  Once alert and extubated, transferred to the recovery room in good condition.  Sponge, instrument, and needle count was reported as correct by circulating nurse x2.     [name], M.D.     JSM/MEDQ  D:  06/09/2011  T:  06/09/2011  Job:  [id number]

## 2011-06-09 NOTE — H&P (Signed)
  History ansd physical exam unchanged

## 2011-06-09 NOTE — Transfer of Care (Signed)
Immediate Anesthesia Transfer of Care Note  Patient: [name] [name]  Procedure(s) Performed: Procedure(s) (LRB): LAPAROSCOPIC ASSISTED VAGINAL HYSTERECTOMY (N/A) SALPINGO OOPHERECTOMY (Bilateral) TRANSVAGINAL TAPE (TVT) PROCEDURE (N/A) CYSTOSCOPY (N/A)  Patient Location: PACU  Anesthesia Type: General  Level of Consciousness: awake, alert  and oriented  Airway & Oxygen Therapy: Patient Spontanous Breathing and Patient connected to nasal cannula oxygen  Post-op Assessment: Report given to PACU RN and Post -op Vital signs reviewed and stable  Post vital signs: Reviewed and stable  Complications: No apparent anesthesia complications

## 2011-06-09 NOTE — OR Nursing (Signed)
Straight Cathed at [phone] by Dr. Arelia Sneddon in OR suite with clear yellow urine return.

## 2011-06-09 NOTE — Progress Notes (Signed)
Patient ID: [name] [name], female   DOB: 1966/03/12, 46 y.o.   MRN: [id number] Dooing wee Af vss abd sofr Good uo  Post op doing well

## 2011-06-09 NOTE — Anesthesia Preprocedure Evaluation (Signed)
Anesthesia Evaluation  Patient identified by MRN, date of birth, ID band Patient awake    Reviewed: Allergy & Precautions, H&P , Patient's Chart, lab work & pertinent test results, reviewed documented beta blocker date and time   History of Anesthesia Complications Negative for: history of anesthetic complications  Airway Mallampati: II TM Distance: >3 FB Neck ROM: full    Dental No notable dental hx.    Pulmonary neg pulmonary ROS,  clear to auscultation  Pulmonary exam normal       Cardiovascular Exercise Tolerance: Good neg cardio ROS regular Normal    Neuro/Psych Negative Neurological ROS  Negative Psych ROS   GI/Hepatic negative GI ROS, Neg liver ROS,   Endo/Other  Negative Endocrine ROS  Renal/GU negative Renal ROS     Musculoskeletal   Abdominal   Peds  Hematology negative hematology ROS (+)   Anesthesia Other Findings   Reproductive/Obstetrics negative OB ROS                           Anesthesia Physical Anesthesia Plan  ASA: II  Anesthesia Plan: General ETT   Post-op Pain Management:    Induction:   Airway Management Planned:   Additional Equipment:   Intra-op Plan:   Post-operative Plan:   Informed Consent: I have reviewed the patients History and Physical, chart, labs and discussed the procedure including the risks, benefits and alternatives for the proposed anesthesia with the patient or authorized representative who has indicated his/her understanding and acceptance.   Dental Advisory Given  Plan Discussed with: CRNA and Surgeon  Anesthesia Plan Comments:         Anesthesia Quick Evaluation  

## 2011-06-09 NOTE — OR Nursing (Signed)
Obtryx System halo implanted transvaginally at mid urethral position in OR suite per Dr Arelia Sneddon.  Serial number [id number] Expires 12/20/2013.

## 2011-06-09 NOTE — Anesthesia Postprocedure Evaluation (Signed)
  Anesthesia Post-op Note  Patient: [name] [name]  Procedure(s) Performed: Procedure(s) (LRB): LAPAROSCOPIC ASSISTED VAGINAL HYSTERECTOMY (N/A) SALPINGO OOPHERECTOMY (Bilateral) TRANSVAGINAL TAPE (TVT) PROCEDURE (N/A) CYSTOSCOPY (N/A)  Patient is awake and responsive. Pain and nausea are reasonably well controlled. Vital signs are stable and clinically acceptable. Oxygen saturation is clinically acceptable. There are no apparent anesthetic complications at this time. Patient is ready for discharge.

## 2011-06-10 ENCOUNTER — Encounter (HOSPITAL_COMMUNITY): Payer: Self-pay | Admitting: Obstetrics and Gynecology

## 2011-06-10 LAB — CBC
MCH: 31.6 pg (ref 26.0–34.0)
MCHC: 33.6 g/dL (ref 30.0–36.0)
Platelets: 131 10*3/uL — ABNORMAL LOW (ref 150–400)
RDW: 12.7 % (ref 11.5–15.5)

## 2011-06-10 MED ORDER — VENLAFAXINE HCL 37.5 MG PO TABS: 37.5000 mg | ORAL_TABLET | Freq: Every day | ORAL | Status: DC

## 2011-06-10 MED ORDER — OXYCODONE-ACETAMINOPHEN 5-500 MG PO CAPS: 1.0000 | ORAL_CAPSULE | ORAL | Status: AC | PRN

## 2011-06-10 MED ORDER — ACETAMINOPHEN 325 MG PO TABS: 650.0000 mg | ORAL_TABLET | ORAL | Status: AC | PRN

## 2011-06-10 NOTE — Anesthesia Postprocedure Evaluation (Signed)
  Anesthesia Post-op Note  Patient: [name] [name]  Procedure(s) Performed: Procedure(s) (LRB): LAPAROSCOPIC ASSISTED VAGINAL HYSTERECTOMY (N/A) SALPINGO OOPHERECTOMY (Bilateral) TRANSVAGINAL TAPE (TVT) PROCEDURE (N/A) CYSTOSCOPY (N/A)  Patient Location: [facility]  Anesthesia Type: General  Level of Consciousness: awake  Airway and Oxygen Therapy: Patient Spontanous Breathing  Post-op Pain: none  Post-op Assessment: Patient's Cardiovascular Status Stable, Respiratory Function Stable, No signs of Nausea or vomiting, Adequate PO intake and Pain level controlled  Post-op Vital Signs: Reviewed and stable  Complications: No apparent anesthesia complications

## 2011-06-10 NOTE — Discharge Summary (Signed)
[name] [name], [name] [name]                ACCOUNT NO.:  [id number]  MEDICAL RECORD NO.:  [id number]  LOCATION:  9320                          FACILITY:  [facility]  PHYSICIAN:  [name], M.D.   DATE OF BIRTH:  30-Jan-1966  DATE OF ADMISSION:  06/09/2011 DATE OF DISCHARGE:  06/10/2011                              DISCHARGE SUMMARY   ADMITTING DIAGNOSIS:  Menorrhagia with stress urinary incontinence.  DISCHARGE DIAGNOSIS:  Menorrhagia with stress urinary incontinence.  PROCEDURES: 1. Laparoscopic-assisted vaginal hysterectomy with bilateral salpingo-     oophorectomy. 2. Mid urethral sling using a transobturator approach and cystoscopy.  For complete history and physical, please see dictated note.  COURSE IN THE HOSPITAL:  The patient underwent the above-noted surgery. She did well through the evening.  The next morning, she was afebrile. She was tolerating a diet.  She had good urine output.  No active bleeding was noted.  All incisions were clear.  Hemoglobin was 9.8.  We will attempt a voiding trial this morning and will be discharged home after that.  In terms of complications, none were encountered during stay in the hospital.  The patient was discharged home in stable condition.  DISPOSITION:  The patient to avoid heavy lifting or vaginal entrance at home.  She is to be careful driving until able to do so without limitations.  She is to call with signs of infection, with temperatures over 100.  Any signs of nausea or vomiting.  Excessive bleeding should be reported.  Excessive pain should be reported.  Signs and symptoms of deep venous thrombosis and pulmonary embolus discussed.  Discharged home on Percocet she needs for pain and stool softener.  PLAN:  Follow up in the office in 1 week.     [name], M.D.     JSM/MEDQ  D:  06/10/2011  T:  06/10/2011  Job:  [id number]

## 2011-06-10 NOTE — Discharge Summary (Signed)
  Patient name  [name], [name] DICTATION#  [id number] CSN# [id number]  [facility], MD 06/10/2011 8:37 AM

## 2011-06-10 NOTE — Addendum Note (Signed)
Addendum  created 06/10/11 0934 by Suella Grove, CRNA   Modules edited:Notes Section

## 2011-06-10 NOTE — Discharge Instructions (Signed)
Hysterectomy A hysterectomy is a procedure where your womb (uterus) is surgically taken out. It will no longer be possible to have menstrual periods or to become pregnant. Removal of the tubes and ovaries (bilateral salpingo-oopherectomy) can be done during this operation as well.   An abdominal hysterectomy is done through a large cut (incision) in the abdomen made by the surgeon.   A vaginal hysterectomy is done through the vagina. There are no abdominal incisions, but there will be incisions on the inside of the vagina.   A laparoscopic assisted vaginal hysterectomy is done through 2 or 3 small incisions in the abdomen, but the uterus is removed and passed through the vagina.  Women who are going to have a hysterectomy should be tested first to make sure there is no cancer of the cervix or in the uterus. INDICATIONS FOR HYSTERECTOMY:  Persistent abnormal bleeding.   Lasting (chronic) pelvic pain.   Endometriosis. This is when the lining of the uterus (endometrium) is misplaced outside of its normal location.   Adenomyosis. This is when the endometrium tissue grows in the muscle of the uterus.   Uterine prolapse. This is when the uterus falls down into the vagina.   Cancer of the uterus or cervix that requires a radical hysterectomy, removal of the uterus, tubes, ovaries, and surrounding lymph nodes.  LET YOUR CAREGIVER KNOW ABOUT:  Allergies (especially to medicines).   Medications taken including herbs, eye drops, over the counter medications, and creams.   Use of steroids (by mouth or creams).   Past problems with anesthetics or numbing medication.   Possibility of pregnancy, if this applies.   History of blood clots (thrombophlebitis).   History of bleeding or blood problems.   Past surgery.   Other health problems.  RISKS AND COMPLICATIONS All surgeries can have risks. Some of these risks are:  A lot of bleeding.   Injury to surrounding organs.   Infection.    Blood clots of the leg, heart, or lung.   Problems with anesthesia.   Early menopause.  BEFORE THE PROCEDURE  Do not take aspirin or blood thinners for a week before surgery, or as directed by your caregiver.   Do not eat or drink anything after midnight the night before surgery, or as directed by your caregiver.   Let your caregiver know if you get a cold or other infectious problems before surgery.   If you are being admitted the day of surgery, you should be present 60 minutes before your procedure or as told by your caregiver.  PROCEDURE   An IV (intravenous) will be placed in your arm. You will be given a drug to make you sleep (anesthetic) during surgery. You may be given a shot in the spine (spinal anesthesia) that will numb your body from the waist down. This will keep you pain-free during surgery.   When you wake from surgery, you will have the IV and a long, narrow, hollow tube (urinary catheter) draining the bladder for 1 or 2 days after surgery. This will make passing your urine easier. It also helps by keeping your bladder empty during surgery.   After surgery, you will be taken to the recovery area where a nurse will watch and check your progress. Once you wake up, stable and taking fluids well, without other problems, you will be allowed to return to your room. Usually you will remain in the hospital 3 to 5 days. You may be given an antibiotic during and after   the surgery and when you go home. Pain medication will be ordered by your caregiver while you are in the hospital and when you go home.  HOME CARE INSTRUCTIONS  Healing will take time. You will have discomfort, tenderness, swelling, and bruising at the operative site for a couple of weeks. This is normal and will get better as time goes on.   Only take over-the-counter or prescription medicines for pain, discomfort, or fever as directed by your caregiver.   Do not take aspirin. It can cause bleeding.   Do not  drive when taking pain medication.   Follow your caregiver's advice regarding diet, exercise, lifting, driving, and general activities.   Resume your usual diet as directed and allowed.   Get plenty of rest and sleep.   Do not douche, use tampons, or have sexual intercourse until your caregiver gives you permission.   Change your bandages (dressings) as directed.   Take your temperature twice a day. Write it down.   Your caregiver may recommend showers instead of baths for a few weeks.   Do not drink alcohol until your caregiver gives you permission.   If you develop constipation, you may take a mild laxative with your caregiver's permission. Bran foods and drinking fluids helps with constipation problems.   Try to have someone home with you for a week or two to help with the household activities.   Make sure you and your family understands everything about your operation and recovery.   Do not sign any legal documents until you feel normal again.   Keep all your follow-up appointments as recommended by your caregiver.  SEEK MEDICAL CARE IF:   There is swelling, redness, or increasing pain in the wound area.   Pus is coming from the wound.   You notice a bad smell from the wound or surgical dressing.   You have pain, redness, and swelling from the intravenous site.   The wound is breaking open (the edges are not staying together).   You feel dizzy or feel like fainting.   You develop pain or bleeding when you urinate.   You develop diarrhea.   You develop nausea and vomiting.   You develop abnormal vaginal discharge.   You develop a rash.   You have any type of abnormal reaction or develop an allergy to your medication.   You need stronger pain medication for your pain.  SEEK IMMEDIATE MEDICAL CARE IF:  You have a fever.   You develop abdominal pain.   You develop chest pain.   You develop shortness of breath.   You pass out.   You develop pain,  swelling, or redness of your leg.   You develop heavy vaginal bleeding with or without blood clots.  Document Released: 10/01/2000 Document Revised: 12/18/2010 Document Reviewed: 08/19/2007 Exit[employer] Patient Information 2012 [employer]. 

## 2011-06-10 NOTE — Progress Notes (Signed)
1 Day Post-Op Procedure(s) (LRB): LAPAROSCOPIC ASSISTED VAGINAL HYSTERECTOMY (N/A) SALPINGO OOPHERECTOMY (Bilateral) TRANSVAGINAL TAPE (TVT) PROCEDURE (N/A) CYSTOSCOPY (N/A)  Subjective: Patient reports tolerating PO.    Objective: I have reviewed patient's vital signs.  General: alert   Abdomin soft positive bowel sounds Incisions clear Good uo   Assessment: s/p Procedure(s) (LRB): LAPAROSCOPIC ASSISTED VAGINAL HYSTERECTOMY (N/A) SALPINGO OOPHERECTOMY (Bilateral) TRANSVAGINAL TAPE (TVT) PROCEDURE (N/A) CYSTOSCOPY (N/A): stable  Plan: Discharge home  Voiding trail.  LOS: 1 day    Kerrion Kemppainen S 06/10/2011, 8:33 AM

## 2011-06-30 ENCOUNTER — Other Ambulatory Visit: Payer: Self-pay | Admitting: Oncology

## 2011-06-30 DIAGNOSIS — R232 Flushing: Secondary | ICD-10-CM

## 2011-06-30 DIAGNOSIS — C50319 Malignant neoplasm of lower-inner quadrant of unspecified female breast: Secondary | ICD-10-CM

## 2011-07-22 ENCOUNTER — Encounter: Payer: Self-pay | Admitting: *Deleted

## 2011-07-22 ENCOUNTER — Telehealth: Payer: Self-pay | Admitting: *Deleted

## 2011-07-22 NOTE — Progress Notes (Unsigned)
Pt reports that she is experiencing "hot flashes" (they have worsened since hysterectomy) pt is currently taking effexor 37.5 with little improvement.  This desk nurse recommended OTC peridan-C. Pt agreed to try it and will call this desk if no improvement

## 2011-07-28 ENCOUNTER — Other Ambulatory Visit: Payer: Self-pay | Admitting: *Deleted

## 2011-07-28 MED ORDER — GABAPENTIN 100 MG PO CAPS: ORAL_CAPSULE | ORAL | Status: DC

## 2011-07-29 NOTE — Telephone Encounter (Signed)
error 

## 2011-09-15 ENCOUNTER — Other Ambulatory Visit: Payer: Self-pay | Admitting: Oncology

## 2011-09-15 DIAGNOSIS — C50919 Malignant neoplasm of unspecified site of unspecified female breast: Secondary | ICD-10-CM

## 2011-10-28 ENCOUNTER — Ambulatory Visit: Payer: BC Managed Care – PPO

## 2011-11-24 ENCOUNTER — Ambulatory Visit
Admission: RE | Admit: 2011-11-24 | Discharge: 2011-11-24 | Disposition: A | Discharge status: Home / Self Care | Payer: BC Managed Care – PPO | Source: Ambulatory Visit | Attending: Oncology | Admitting: Oncology

## 2011-11-24 DIAGNOSIS — Z1231 Encounter for screening mammogram for malignant neoplasm of breast: Secondary | ICD-10-CM

## 2011-12-04 ENCOUNTER — Telehealth: Payer: Self-pay | Admitting: *Deleted

## 2011-12-04 ENCOUNTER — Other Ambulatory Visit (HOSPITAL_BASED_OUTPATIENT_CLINIC_OR_DEPARTMENT_OTHER): Payer: BC Managed Care – PPO | Admitting: Lab

## 2011-12-04 ENCOUNTER — Encounter: Payer: Self-pay | Admitting: Family

## 2011-12-04 ENCOUNTER — Ambulatory Visit (HOSPITAL_BASED_OUTPATIENT_CLINIC_OR_DEPARTMENT_OTHER): Payer: BC Managed Care – PPO | Admitting: Family

## 2011-12-04 VITALS — BP 145/86 | HR 88 | Temp 98.5°F | Resp 20 | Ht 68.0 in | Wt 178.5 lb

## 2011-12-04 DIAGNOSIS — C50219 Malignant neoplasm of upper-inner quadrant of unspecified female breast: Secondary | ICD-10-CM

## 2011-12-04 DIAGNOSIS — Z853 Personal history of malignant neoplasm of breast: Secondary | ICD-10-CM

## 2011-12-04 DIAGNOSIS — Z170 Estrogen receptor positive status [ER+]: Secondary | ICD-10-CM

## 2011-12-04 DIAGNOSIS — E559 Vitamin D deficiency, unspecified: Secondary | ICD-10-CM

## 2011-12-04 DIAGNOSIS — C50919 Malignant neoplasm of unspecified site of unspecified female breast: Secondary | ICD-10-CM

## 2011-12-04 LAB — CBC WITH DIFFERENTIAL/PLATELET
BASO%: 0.6 % (ref 0.0–2.0)
LYMPH%: 20.2 % (ref 14.0–49.7)
MCHC: 34.4 g/dL (ref 31.5–36.0)
MONO#: 0.7 10*3/uL (ref 0.1–0.9)
Platelets: 196 10*3/uL (ref 145–400)
RBC: 4.37 10*6/uL (ref 3.70–5.45)
RDW: 12.8 % (ref 11.2–14.5)
WBC: 6.4 10*3/uL (ref 3.9–10.3)
lymph#: 1.3 10*3/uL (ref 0.9–3.3)

## 2011-12-04 LAB — COMPREHENSIVE METABOLIC PANEL
ALT: 43 U/L — ABNORMAL HIGH (ref 0–35)
CO2: 26 mEq/L (ref 19–32)
Sodium: 139 mEq/L (ref 135–145)
Total Bilirubin: 0.4 mg/dL (ref 0.3–1.2)
Total Protein: 7.5 g/dL (ref 6.0–8.3)

## 2011-12-04 LAB — CANCER ANTIGEN 27.29: CA 27.29: 24 U/mL (ref 0–39)

## 2011-12-04 NOTE — Progress Notes (Signed)
Hematology and Oncology Follow Up Visit  [name] [name] [id number] [birth year] 46 y.o. 12/04/2011 10:09 AM PCP Dr Arelia Sneddon Dr Carolynne Edouard Principle Diagnosis: Stage IIA T2NO er/pr+ , her 2-ve breast cancer, s/p TC x 4, last cycle 05/19/08, s/p TRAM flap reconstruction to lt breast on tamoxifen 2. Hx of anemia.  Interim History:  There have been no intercurrent illness, hospitalizations or medication changes. Had TAH/BSO 06/09/11 with no complications. Hot flashes have increased since hyst. Take gabapentin and Effexor for hot flashes. Has occasional hot flashes, improved with medications. Reports vaginal dryness related to antiestrogen therapy. No headache or blurred vision. No cough or shortness of breath. No abdominal pain or new bone pain. Bowel and bladder function are normal. Appetite is good, with adequate fluid intake. Remainder of the 10 point  review of systems is negative.  Medications: I have reviewed the patient's current medications.  Allergies: No Known Allergies  Past Medical History, Surgical history, Social history, and Family History were reviewed and updated.  Physical Exam: Blood pressure 145/86, pulse 88, temperature 98.5 F (36.9 C), temperature source Oral, resp. rate 20, height 5\' 8"  (1.727 m), weight 178 lb 8 oz (80.967 kg), last menstrual period 05/20/2011. ECOG:  General appearance: alert, cooperative and appears stated age Head: Normocephalic, without obvious abnormality, atraumatic Neck: no adenopathy, no carotid bruit, no JVD, supple, symmetrical, trachea midline and thyroid not enlarged, symmetric, no tenderness/mass/nodules Lymph nodes: Cervical, supraclavicular, and axillary nodes normal. Cardiac : regular rate and rhythm Pulmonary:clear to auscultation bilaterally and normal percussion bilaterally Breasts: inspection negative, no nipple discharge or bleeding, no masses or nodularity palpable,. S/p lt tram,  Abdomen:soft, non-tender; bowel sounds normal; no masses,  no  organomegaly Extremities negative Neuro: alert, oriented, normal speech, no focal findings or movement disorder noted BREAST EXAM: In the supine position, with the right arm over the head, the right nipple is everted. No periareolar edema or nipple discharge. No mass in any quadrant or subareolar region. No redness of the skin. Implant in place, no signs of rupture. No right axillary adenopathy. With the left arm over the head, product of left TRAM reconstruction. The reconstructed left nipple is everted with tattooed areola.  No mass in any quadrant or subareolar region. No redness of the skin. No left axillary adenopathy.    Lab Results: Lab Results  Component Value Date   WBC 6.4 12/04/2011   HGB 14.4 12/04/2011   HCT 41.7 12/04/2011   MCV 95.4 12/04/2011   PLT 196 12/04/2011    Impression: 1.  History Stage IIA left breast cancer, no evidence of recurrence clinically or mammographically.  2. Vaginal dryness on antiestrogen.  3. Good tolerance of Anastrozole.  4. Blood counts have returned to normal after chemo.   Plan: 1. Return in 6 months with Dr. Donnie Coffin with lab prior.  2. Continue anastrozole.  3. Vit E capsules vaginally.    Colman Cater, FNP-C 8/15/201310:09 AM

## 2011-12-04 NOTE — Patient Instructions (Addendum)
Return in 6 months to see Dr. Donnie Coffin with lab prior.

## 2011-12-04 NOTE — Telephone Encounter (Signed)
Made patient an six month check up with dr.rubin with the labs being one week before the md appointment

## 2011-12-04 NOTE — Telephone Encounter (Signed)
mailed out calendar to inform the patient of the new date and time 

## 2011-12-15 ENCOUNTER — Other Ambulatory Visit: Payer: Self-pay | Admitting: Oncology

## 2012-04-20 ENCOUNTER — Telehealth: Payer: Self-pay | Admitting: *Deleted

## 2012-04-20 NOTE — Telephone Encounter (Signed)
patient was informed about the md not with the pactice any longer

## 2012-05-21 ENCOUNTER — Encounter: Payer: Self-pay | Admitting: Oncology

## 2012-05-21 ENCOUNTER — Telehealth: Payer: Self-pay | Admitting: *Deleted

## 2012-05-21 NOTE — Telephone Encounter (Signed)
Called and spoke with patient to reschedule patient's appt. Confirmed appt. With [name] [name] on 06/15/12 at 1145.  Then will become Dr. Welton Flakes.

## 2012-05-27 ENCOUNTER — Other Ambulatory Visit: Payer: BC Managed Care – PPO | Admitting: Lab

## 2012-06-03 ENCOUNTER — Ambulatory Visit: Payer: BC Managed Care – PPO | Admitting: Oncology

## 2012-06-15 ENCOUNTER — Ambulatory Visit (HOSPITAL_BASED_OUTPATIENT_CLINIC_OR_DEPARTMENT_OTHER): Payer: BC Managed Care – PPO | Admitting: Nurse Practitioner

## 2012-06-15 ENCOUNTER — Telehealth: Payer: Self-pay | Admitting: Oncology

## 2012-06-15 VITALS — BP 134/84 | HR 81 | Temp 98.4°F | Resp 20 | Ht 68.0 in | Wt 189.0 lb

## 2012-06-15 NOTE — Progress Notes (Signed)
[facility] OFFICE PROGRESS NOTE  No primary provider on file.  DIAGNOSIS: Invasive lobular carcinoma; Stage IIA T2NO s/p adjuvant chemo with TC x 4,   PAST THERAPY:  Mastectomy with Tram flap in 06/01/2007         Tamoxifen until 06/03/2011 at which time she was switched to Arimidex     TAH/BSO 06/09/11   CURRENT THERAPY: Arimidex 1 mg  INTERVAL HISTORY: Nikita Surman 47 y.o. female returns for regularly scheduled follow up of her breast cancer. Her main concern today has nothing to do with her cancer. She is worried she may be developing symptoms of heart disease. She has a brother who is only a few years older than her who recently had a heart attack. She reports occasional numbness in fingers (especially if she sleeps a certain way) and isolated transient incidents of chest pain/pressure. She was strongly counseled to follow up with her primary care physician for a thorough work up of her concerns. She states she will be seeing someone in the next 2 weeks.  I did assure her I do not think that her symptoms at this time (none) are necessarily indicative of a problem, but with her family history, it certainly bears evaluation. We also reviewed symptoms of a heart attack with emphasis on need to seek emergency care immediately. She voiced understanding.     MEDICAL HISTORY: Past Medical History  Diagnosis Date  . Anemia   . Breast cancer   . Hot flashes   . SVD (spontaneous vaginal delivery)     SURGICAL HISTORY:  Past Surgical History  Procedure Laterality Date  . Appendectomy    . Mastectomy    . Breast enhancement surgery    . Transfer adjacent tissue trunk    . Cesarean section    . Laparoscopic assisted vaginal hysterectomy  06/09/2011    Procedure: LAPAROSCOPIC ASSISTED VAGINAL HYSTERECTOMY;  Surgeon: [name], MD;  Location: [facility];  Service: Gynecology;  Laterality: N/A;  . Salpingoophorectomy  06/09/2011    Procedure: SALPINGO OOPHERECTOMY;  Surgeon: [name], MD;  Location: [facility];  Service: Gynecology;  Laterality: Bilateral;  . Bladder suspension  06/09/2011    Procedure: TRANSVAGINAL TAPE (TVT) PROCEDURE;  Surgeon: [name], MD;  Location: [facility];  Service: Gynecology;  Laterality: N/A;  . Cystoscopy  06/09/2011    Procedure: CYSTOSCOPY;  Surgeon: [name], MD;  Location: [facility];  Service: Gynecology;  Laterality: N/A;   Pathology - Mastectomy [facility] [address] [place], [place] [address] [phone]  REPORT OF SURGICAL PATHOLOGY  Case #: [id number] Patient Name: EVANNY, ELLERBE Office Chart Number: N/A  MRN: [id number] Pathologist: Alden Server A. Delila Spence, MD DOB/Age 07-19-1965 (Age: 89) Gender: F Date Taken: 01/24/2008 Date Received: 01/24/2008  FINAL DIAGNOSIS  MICROSCOPIC EXAMINATION AND DIAGNOSIS  1. LYMPH NODE, LEFT AXILLA, SENTINEL, BIOPSY: - NO METASTATIC CARCINOMA IDENTIFIED. - CYTOKERATIN STAINS: NEGATIVE.  2. LYMPH NODE, SENTINEL, LEFT AXILLA, BIOPSY: - NO METASTATIC CARCINOMA IDENTIFIED. - CYTOKERATIN STAINS: NEGATIVE.  3. BREAST, LEFT, SIMPLE MASTECTOMY: - INVASIVE MAMMARY CARCINOMA CONSISTENT WITH INVASIVE LOBULAR CARCINOMA. (TWO LESIONS, 2.3 AND 1.5 CM IN GREATEST DIMENSION). - FOCI OF SLIGHT EPITHELIAL ATYPIA ASSOCIATED WITH NODULAR ADENOSIS. - SKIN, NIPPLE AND DEEP MARGIN NEGATIVE FOR TUMOR.  COMMENT 1  lymph nodes is negative. . Cytokeratin AE1/AE3 stained controls reacted appropriately. (EAA:mw, 01/25/08)  3. ONCOLOGY TABLE-BREAST, INCISIONAL/EXCISIONAL BIOPSY OR MASTECTOMY WITH LYMPH NODES  1. Maximum tumor  size (cm): 2.3 cm. 2. Margins: Negative for tumor. Invasive component, distance to closest margin: 0.5 cm, anterior soft tissue margin. In situ component, distance to closest margin: > 1.0 cm. 3. Vascular/Lymphatic invasion: Not identified. 4. Histology, invasive component: Lobular carcinoma. 5. Grade, invasive component (Elston-Ellis  modified Scarff-Bloom-Richardson): I Tubule formation grade: 1 Nuclear pleomorphism grade: 1 Mitotic grade: 1 6. Extensive intraductal component (JCRT): Negative. 7. Histology, in situ component: Lobular carcinoma in situ. 8. Grade, in situ component: Low grade. 9. Treatment effect (if treated with neoadjuvant therapy): Not identified. 10. Multicentric (separate tumors in different quadrants): Yes. 11. Multifocal (separate tumors in same quadrant or biopsy): No. 12. Axillary lymph nodes: #examined: 2 #with metastasis: 0 13. TNM Code: pT2, pN0(i-), pMX 14. Breast Prognostic Markers: Case number: [id number] Estrogen receptor 93%, positive Progesterone receptor 100%, positive Ki 67 (Mib-1) 6% Her 2 neu (HercepTest) 2+, equivocal Her 2 neu by CISH Amplified (also see [id number]) 15. Non-neoplastic breast: Nodular adenosis 16. Comments: Both tumors have similar morphologic features although one of the tumors was reported as Her 2 neu negative with similar results on other markers (estrogen and progesterone receptors on the breast prognostic profile). The tissue from the left breast, 7:30, was also submitted for Her 2 neu by CISH and showed amplification.  An E-cadherin stain was performed on a representative section of the invasive mammary carcinoma and is negative with a positive internal control. These findings are supportive of the diagnosis of invasive lobular carcinoma. E-cadherin stain control reacted appropriately. (RA:cdc 01/26/08)     MEDICATIONS: Current Outpatient Prescriptions  Medication Sig Dispense Refill  . anastrozole (ARIMIDEX) 1 MG tablet TAKE 1 TABLET BY MOUTH EVERY DAY  30 tablet  5  . ferrous sulfate 325 (65 FE) MG tablet Take 325 mg by mouth daily with breakfast.      . gabapentin (NEURONTIN) 100 MG capsule TAKE 1 CAPSULE BY MOUTH AT NIGHT, TITRATE UP TO THREE TIMES DAILY AS DIRECTED  90 capsule  5  . ibuprofen (ADVIL,MOTRIN) 200 MG tablet Take 400 mg by  mouth every 6 (six) hours as needed. For ocassional headache.      . venlafaxine (EFFEXOR) 37.5 MG tablet TAKE 1 TABLET BY MOUTH EVERY DAY MAY INCREASE TO 2 TABLETS EVERY DAY  60 tablet  5   No current facility-administered medications for this visit.    ALLERGIES:  has No Known Allergies.  REVIEW OF SYSTEMS: General: denies unexplained weight loss, fatigue, night sweats, fevers, chills HEENT: denies dizziness, loss of balance Cardiac: occasional episodes of chest pain/ pressure/palpitations Lungs: denies wheezing, shortness of breath or productive cough Abd: denies nausea, vomiting, constipation, diarrhea, blood in stool or urine - states she has had episodes of indigestion Extremities: admits to numbness, tingling, in arm when sleeping certain way at night  The rest of the 14-point review of system was negative.   Filed Vitals:   06/15/12 1146  BP: 134/84  Pulse: 81  Temp: 98.4 F (36.9 C)  Resp: 20   Wt Readings from Last 3 Encounters:  06/15/12 189 lb (85.73 kg)  12/04/11 178 lb 8 oz (80.967 kg)  06/09/11 176 lb (79.833 kg)   ECOG Performance status: 0  PHYSICAL EXAMINATION: 47 yr old woman who appears her stated age  General:  well-nourished in no acute distress.   Eyes:  no scleral icterus.   ENT:  There were no oropharyngeal lesions.  Neck was without thyromegaly.  Lymphatics:  Negative cervical, supraclavicular, axillary, or inguinal adenopathy.  Respiratory:  lungs were clear bilaterally without wheezing or crackles.  Cardiovascular:  Regular rate and rhythm, S1/S2, without murmur, rub or gallop.  There was no pedal edema.   Breast: no nipple retraction, no nipple discharge, no unusual masses or thickening, negative axillae; Left breast scars noted implant without abnormality. GI:  TRAM scar noted; abdomen was soft, flat, nontender, nondistended, without organomegaly.  Musculoskeletal:  no spinal tenderness of palpation of vertebral spine.   Skin exam was without  echymosis, petichae.   Neuro exam was nonfocal.  Cranial nerves II- XII grossly intact Patient was able to get on and off exam table without assistance.  Gait was normal.  Patient was alerted and oriented.  Attention was good.   Language was appropriate.  Mood was normal without depression.  Speech was not pressured.  Thought content was not tangential.    LABORATORY/RADIOLOGY DATA:  Lab Results  Component Value Date   WBC 6.4 12/04/2011   HGB 14.4 12/04/2011   HCT 41.7 12/04/2011   PLT 196 12/04/2011   GLUCOSE 80 12/04/2011   ALT 43* 12/04/2011   AST 31 12/04/2011   NA 139 12/04/2011   K 4.5 12/04/2011   CL 103 12/04/2011   CREATININE 0.92 12/04/2011   BUN 14 12/04/2011   CO2 26 12/04/2011   Last mammogram August 2013 unremarkable.   ASSESSMENT AND PLAN: 1.Isolated incidents of transient chest pressure/pain and some numbness in fingers upon awaking - patient is following up with her PCP for evaluation  2. Invasive lobular carcinoma; Stage IIA T2NO   Mastectomy with Tram flap in 06/01/2007  s/p adjuvant chemo with TC x 4,  Followed by Tamoxifen until 06/03/2011 - then she was switched to Arimidex TAH/BSO 06/09/11  Currently taking Arimidex 1 mg, which she is tolerating well.    Upon review of patient's pathology from her mastectomy in 2009 (pathology specimen Case #: [id number]) showed that she was Her2 Neu amplified. I discussed this with Dr. Darnelle Catalan who will discuss further treatment with the patient. She will be seen in the next 6 weeks.   Bobbe Medico, AOCNP, NP-C

## 2012-06-16 ENCOUNTER — Other Ambulatory Visit: Payer: Self-pay

## 2012-06-16 ENCOUNTER — Other Ambulatory Visit: Payer: Self-pay | Admitting: Oncology

## 2012-06-16 ENCOUNTER — Encounter: Payer: Self-pay | Admitting: Nurse Practitioner

## 2012-06-20 ENCOUNTER — Other Ambulatory Visit: Payer: Self-pay | Admitting: Oncology

## 2012-06-20 NOTE — Progress Notes (Signed)
I was alerted by our physician's assistant upon review of the patient's pathology she noted a positive result for the HDR 2 test. I have reviewed all this and also discussed it with of Dr. Colonel Bald. It is his feeling that the result was the best equivocal, but there are other negative results, and that taking it all in all she doubts that this case was HER-2 positive. That was the way was interpreted by Dr. Donnie Coffin, and I concur with that interpretation of. I will discuss this with the patient at her next visit here, which will be in August.

## 2012-06-22 ENCOUNTER — Telehealth: Payer: Self-pay | Admitting: Oncology

## 2012-06-22 NOTE — Telephone Encounter (Signed)
S/w the pt and she is aware of her aug 2014 appts with the md.

## 2012-07-06 ENCOUNTER — Other Ambulatory Visit: Payer: Self-pay | Admitting: Oncology

## 2012-07-16 ENCOUNTER — Other Ambulatory Visit: Payer: Self-pay | Admitting: Oncology

## 2012-07-25 ENCOUNTER — Other Ambulatory Visit: Payer: Self-pay | Admitting: Oncology

## 2012-11-04 ENCOUNTER — Telehealth: Payer: Self-pay | Admitting: Oncology

## 2012-11-04 NOTE — Telephone Encounter (Signed)
s.w. pt and advised that lab was moved to 8.19 per request..Marland KitchenMarland KitchenDone

## 2012-11-24 ENCOUNTER — Ambulatory Visit: Payer: BC Managed Care – PPO

## 2012-11-25 ENCOUNTER — Ambulatory Visit: Payer: BC Managed Care – PPO

## 2012-11-26 ENCOUNTER — Ambulatory Visit: Payer: BC Managed Care – PPO

## 2012-11-30 ENCOUNTER — Other Ambulatory Visit: Payer: BC Managed Care – PPO | Admitting: Lab

## 2012-12-02 ENCOUNTER — Ambulatory Visit
Admission: RE | Admit: 2012-12-02 | Discharge: 2012-12-02 | Disposition: A | Discharge status: Home / Self Care | Payer: 59 | Source: Ambulatory Visit

## 2012-12-02 ENCOUNTER — Ambulatory Visit: Payer: BC Managed Care – PPO

## 2012-12-02 ENCOUNTER — Other Ambulatory Visit: Payer: Self-pay

## 2012-12-02 DIAGNOSIS — Z1231 Encounter for screening mammogram for malignant neoplasm of breast: Secondary | ICD-10-CM

## 2012-12-02 DIAGNOSIS — Z853 Personal history of malignant neoplasm of breast: Secondary | ICD-10-CM

## 2012-12-07 ENCOUNTER — Ambulatory Visit (HOSPITAL_BASED_OUTPATIENT_CLINIC_OR_DEPARTMENT_OTHER): Payer: 59 | Admitting: Oncology

## 2012-12-07 ENCOUNTER — Telehealth: Payer: Self-pay | Admitting: Oncology

## 2012-12-07 ENCOUNTER — Other Ambulatory Visit (HOSPITAL_BASED_OUTPATIENT_CLINIC_OR_DEPARTMENT_OTHER): Payer: 59 | Admitting: Lab

## 2012-12-07 VITALS — BP 150/90 | HR 79 | Temp 98.4°F | Resp 18 | Ht 68.0 in | Wt 192.4 lb

## 2012-12-07 DIAGNOSIS — C50919 Malignant neoplasm of unspecified site of unspecified female breast: Secondary | ICD-10-CM

## 2012-12-07 DIAGNOSIS — C50219 Malignant neoplasm of upper-inner quadrant of unspecified female breast: Secondary | ICD-10-CM

## 2012-12-07 DIAGNOSIS — Z170 Estrogen receptor positive status [ER+]: Secondary | ICD-10-CM

## 2012-12-07 DIAGNOSIS — D493 Neoplasm of unspecified behavior of breast: Secondary | ICD-10-CM | POA: Insufficient documentation

## 2012-12-07 LAB — CBC WITH DIFFERENTIAL/PLATELET
BASO%: 0.6 % (ref 0.0–2.0)
EOS%: 4.2 % (ref 0.0–7.0)
HCT: 39.9 % (ref 34.8–46.6)
LYMPH%: 19.1 % (ref 14.0–49.7)
MCH: 32.1 pg (ref 25.1–34.0)
MCHC: 34.5 g/dL (ref 31.5–36.0)
MONO%: 13.5 % (ref 0.0–14.0)
NEUT%: 62.6 % (ref 38.4–76.8)
Platelets: 193 10*3/uL (ref 145–400)
lymph#: 1.2 10*3/uL (ref 0.9–3.3)

## 2012-12-07 LAB — COMPREHENSIVE METABOLIC PANEL (CC13)
ALT: 104 U/L — ABNORMAL HIGH (ref 0–55)
AST: 71 U/L — ABNORMAL HIGH (ref 5–34)
Creatinine: 0.9 mg/dL (ref 0.6–1.1)
Total Bilirubin: 0.42 mg/dL (ref 0.20–1.20)

## 2012-12-07 NOTE — Telephone Encounter (Signed)
, °

## 2012-12-07 NOTE — Progress Notes (Signed)
ID: [name] [name] OB: 17-Feb-1966  MR#: [id number]  WUJ#:[id number]  PCP: No primary provider on file. GYN:  [name] [name] SU:  [name] [name] OTHER MD: [name] [name], [name] [name]   HISTORY OF PRESENT ILLNESS: From Dr. Doreatha Massed intake node 02/16/2008:  "This woman has had annual screening mammography.  She questioned the presence of a mass last year and had mammograms, which were negative.  She ultimately had a followup screening mammogram in July 2009, which showed a possible mass in the left breast.  The patient had a biopsy of the left breast lesion on 11/17/2007, which showed a lobular type cancer, ER and PR positive at 82% and 100% respectively proliferative index 16%, HER-2 was 0.  She had a followup MRI scan on 11/24/2007, which showed three left breast masses, one was a 2.4 x 2 x 2.1 spiculated enhancing mass at 11 o'clock position, which had a post biopsy clip.  A second lesion was in the lower inner quadrant left breast 8.30 position, which was spiculated.  A third mass in the 2.30 position measuring 1.1 x 1 x 0.9 cm was also noted.  The patient was referred for an additional ultrasound and these two other lesions were identified and so a separate biopsy was performed of the lesion at 7.30 on 11/25/2007 and this did in fact show a grade 1 of 3 lobular type cancer.  This was ER and PR positive at 93% and 100% respectively, proliferative index 6%, HER-2 was 2+, and with CISH analysis, this proved to be amplified at a ratio of 2.  Because of the multicentric nature of her cancer, the patient and the surgeon elected to undergo a mastectomy on 01/24/2008 with intention to do reconstruction.  The final pathology showed two separate lesions measuring 2.3 and 1.5 cm.  All margins were clear.  Two sentinel lymph nodes were negative for malignancy."  Her subsequent history is as detailed below  INTERVAL HISTORY: [name] [name] returns today for followup of her breast cancer. The interval history is generally  unremarkable. She is tolerating anastrozole well, with mild hot flashes controlled on the gabapentin and venlafaxine. She is not exercising regularly.  REVIEW OF SYSTEMS: She just came back from a trip to the beach where on a hot day around noon she felt dizzy, very hot, and had palpitations. This does sound like early sunstroke and she admits that she was dehydrated at that time. She says she does get palpitations sometimes associated with activity, not obviously with anxiety, although she does admit to being anxious sometimes. She has had no chest pain or pressure, and no shortness of breath, but she does have "a bad feeling" when these palpitations occur. She describes herself as mildly fatigued. She has pain sometimes at her surgical site. She has mild stress urinary incontinence. Her husband tells her that she snores horrendously and she wonders if she has sleep apnea problems. She has low back pain which is not more frequent or intense than prior. A detailed review of systems today was otherwise noncontributory  PAST MEDICAL HISTORY: Past Medical History  Diagnosis Date  . Anemia   . Breast cancer   . Hot flashes   . SVD (spontaneous vaginal delivery)     PAST SURGICAL HISTORY: Past Surgical History  Procedure Laterality Date  . Appendectomy    . Mastectomy    . Breast enhancement surgery    . Transfer adjacent tissue trunk    . Cesarean section    . Laparoscopic assisted  vaginal hysterectomy  06/09/2011    Procedure: LAPAROSCOPIC ASSISTED VAGINAL HYSTERECTOMY;  Surgeon: [name], MD;  Location: [facility];  Service: Gynecology;  Laterality: N/A;  . Salpingoophorectomy  06/09/2011    Procedure: SALPINGO OOPHERECTOMY;  Surgeon: [name], MD;  Location: [facility];  Service: Gynecology;  Laterality: Bilateral;  . Bladder suspension  06/09/2011    Procedure: TRANSVAGINAL TAPE (TVT) PROCEDURE;  Surgeon: [name], MD;  Location: [facility];  Service: Gynecology;  Laterality: N/A;  .  Cystoscopy  06/09/2011    Procedure: CYSTOSCOPY;  Surgeon: [name], MD;  Location: [facility];  Service: Gynecology;  Laterality: N/A;    FAMILY HISTORY No family history on file. Both the patient's parents are alive, in their 5s. The patient has 3 brothers, no sisters. The only breast cancer in the family it was 1 of the patient's 4 maternal aunts. She is not sure what age she was diagnosed. There is no history of ovarian cancer in the family.  GYNECOLOGIC HISTORY:  Menarche age 27, first live birth age 64, she is GX P2. Her most recent menstrual period was January 2014, at which time she underwent her hysterectomy and bilateral salpingo-oophorectomy (SZ D. [phone])  SOCIAL HISTORY:  She works in Corporate treasurer. She has 2 children from her first marriage a 15 year old daughter currently living with her (this daughter has a 74-year-old son); she is studying to be a Water quality scientist. The patient also has a 33 year old son who is in the [employer], currently stationed in [place]. The patient's a second husband, [name], works for [employer]. He has 2 children of his own, a stepson, [name], who has a Development worker, international aid; and a Museum/gallery conservator, [name], 49 years old. At home it to the patient, her husband, the patient's daughter, and her husband [name]. The patient is not a church attender    ADVANCED DIRECTIVES: Not in place   HEALTH MAINTENANCE: History  Substance Use Topics  . Smoking status: Never Smoker   . Smokeless tobacco: Not on file  . Alcohol Use: 7.2 oz/week    12 Cans of beer per week     Colonoscopy:  PAP:  Bone density:  Lipid panel:  No Known Allergies  Current Outpatient Prescriptions  Medication Sig Dispense Refill  . anastrozole (ARIMIDEX) 1 MG tablet TAKE 1 TABLET BY MOUTH EVERY DAY  30 tablet  5  . ferrous sulfate 325 (65 FE) MG tablet Take 325 mg by mouth daily with breakfast.      . gabapentin (NEURONTIN) 100 MG capsule Take 1 capsule (100 mg total) by mouth 3 (three)  times daily.  90 capsule  5  . ibuprofen (ADVIL,MOTRIN) 200 MG tablet Take 400 mg by mouth every 6 (six) hours as needed. For ocassional headache.      . venlafaxine (EFFEXOR) 37.5 MG tablet TAKE 1 TABLET BY MOUTH EVERY DAY MAY INCREASE TO 2 TABLETS EVERY DAY  60 tablet  5   No current facility-administered medications for this visit.    OBJECTIVE: Young white woman who appears stated age 29 Vitals:   12/07/12 1141  BP: 150/90  Pulse: 79  Temp: 98.4 F (36.9 C)  Resp: 18     Body mass index is 29.26 kg/(m^2).    ECOG FS: 0  Sclerae unicteric Oropharynx clear No cervical or supraclavicular adenopathy Lungs no rales or rhonchi Heart regular rate and rhythm, no murmur appreciated Abd benign MSK no focal spinal tenderness, no peripheral edema Neuro: non-focal, well-oriented,  appropriate affect Breasts: The right breast is unremarkable. The left breast is status post mastectomy and TRAM reconstruction. There is no evidence of local recurrence. The left axilla is benign.   LAB RESULTS:  CMP     Component Value Date/Time   NA 139 12/04/2011 0838   K 4.5 12/04/2011 0838   CL 103 12/04/2011 0838   CO2 26 12/04/2011 0838   GLUCOSE 80 12/04/2011 0838   BUN 14 12/04/2011 0838   CREATININE 0.92 12/04/2011 0838   CALCIUM 9.6 12/04/2011 0838   PROT 7.5 12/04/2011 0838   ALBUMIN 4.6 12/04/2011 0838   AST 31 12/04/2011 0838   ALT 43* 12/04/2011 0838   ALKPHOS 97 12/04/2011 0838   BILITOT 0.4 12/04/2011 0838   GFRNONAA >60 06/27/2008 0840   GFRAA  Value: >60        The eGFR has been calculated using the MDRD equation. This calculation has not been validated in all clinical situations. eGFR's persistently <60 mL/min signify possible Chronic Kidney Disease. 06/27/2008 0840    I No results found for this basename: SPEP, UPEP,  kappa and lambda light chains    Lab Results  Component Value Date   WBC 6.1 12/07/2012   NEUTROABS 3.8 12/07/2012   HGB 13.8 12/07/2012   HCT 39.9 12/07/2012   MCV 93.1  12/07/2012   PLT 193 12/07/2012      Chemistry      Component Value Date/Time   NA 139 12/04/2011 0838   K 4.5 12/04/2011 0838   CL 103 12/04/2011 0838   CO2 26 12/04/2011 0838   BUN 14 12/04/2011 0838   CREATININE 0.92 12/04/2011 0838      Component Value Date/Time   CALCIUM 9.6 12/04/2011 0838   ALKPHOS 97 12/04/2011 0838   AST 31 12/04/2011 0838   ALT 43* 12/04/2011 0838   BILITOT 0.4 12/04/2011 0838       Lab Results  Component Value Date   LABCA2 24 12/04/2011    No components found with this basename: [id number]    No results found for this basename: INR,  in the last 168 hours  Urinalysis    Component Value Date/Time   COLORURINE YELLOW 01/20/2008 1418   APPEARANCEUR CLEAR 01/20/2008 1418   LABSPEC 1.011 01/20/2008 1418   PHURINE 6.5 01/20/2008 1418   GLUCOSEU NEGATIVE 01/20/2008 1418   HGBUR MODERATE* 01/20/2008 1418   BILIRUBINUR NEGATIVE 01/20/2008 1418   KETONESUR NEGATIVE 01/20/2008 1418   PROTEINUR NEGATIVE 01/20/2008 1418   UROBILINOGEN 0.2 01/20/2008 1418   NITRITE NEGATIVE 01/20/2008 1418   LEUKOCYTESUR NEGATIVE 01/20/2008 1418    STUDIES: Mm Digital Screening Unilat R  12/03/2012   *RADIOLOGY REPORT*  Clinical Data: Screening.  DIGITAL SCREENING UNILATERAL RIGHT MAMMOGRAM WITH CAD  Comparison:  Previous exam(s).  FINDINGS:  ACR Breast Density Category d:  The breast tissue is extremely dense, which lowers the sensitivity of mammography.  There are no findings suspicious for malignancy. Right retropectoral saline implant in place.  Images were processed with CAD.  IMPRESSION: No mammographic evidence of malignancy.  A result letter of this screening mammogram will be mailed directly to the patient.  RECOMMENDATION: Screening mammogram in one year. (Code:SM-B-01Y)  BI-RADS CATEGORY 1:  Negative.   Original Report Authenticated By: Annia Belt, M.D    ASSESSMENT: 47 y.o. [place]   (1) status post left mastectomy and sentinel lymph node sampling 01/24/2008 for an  mpT2 pN0, stage IIA invasive lobular breast cancer, grade 1, estrogen receptor 93%  positive, progesterone receptor 100% positive, MIB-16%, and HER-2 amplified by CISH with a ratio of 2.02  (2) Oncotype DX score of 18 predicted a distant recurrence within the next 10 years of 18% if the patient's only systemic treatment was tamoxifen for 5 years. HER-2 was negative on this study.  (3) Adjuvant chemotherapy consisted of cyclophosphamide and docetaxel x4, completed January of 2010  (4) adjuvant anti-estrogen therapy consisted of tamoxifen between February of 2010 and February of 2013, and anastrozole started at that point  (5) status post TAH BSO with benign pathology 06/09/2011  PLAN: We spent approximately 1 hour going over her initial diagnosis, and treatment course, as well as her prognosis. We discussed the fact that her initial tumor biopsy showed her cancer to be borderline positive for HER-2, and that normally she would have been expected to receive trastuzumab on that basis. However the Oncotype did not show HER-2 positivity in her tumor. Dr. Donnie Coffin understood this to be negative, and discussed this with the patient in his note from 03/06/2008.  The patient received otherwise standard chemotherapy and antiestrogen therapy, on which she continues. We reviewed the fact that the current standard of care is still 5 years of antiestrogen therapy when a combination of tamoxifen and aromatase inhibitors are used. Accordingly the plan will be for her to go off anti-estrogens in February of 2015 when she sees me next.  Idabell has concerns regarding her palpitations. From the history she gives me these seem to be related to anxiety and possibly to early sunstroke most recently at the beach. However to remove any doubts I am referring her to cardiology for definitive evaluation. She also gives me a history suggestive of sleep apnea. She is interested in having that evaluated by a pulmonologist and I have  referred her to [facility] in that regard.  Otherwise she'll see me again in February of 2015. Assuming all goes well at that time we will likely release her from followup here.    Lowella Dell, MD   12/07/2012 12:08 PM

## 2012-12-28 ENCOUNTER — Other Ambulatory Visit: Payer: Self-pay | Admitting: Obstetrics and Gynecology

## 2012-12-28 DIAGNOSIS — Z853 Personal history of malignant neoplasm of breast: Secondary | ICD-10-CM

## 2012-12-28 DIAGNOSIS — R922 Inconclusive mammogram: Secondary | ICD-10-CM

## 2013-01-07 ENCOUNTER — Encounter: Payer: Self-pay | Admitting: *Deleted

## 2013-01-13 ENCOUNTER — Encounter: Payer: Self-pay | Admitting: Internal Medicine

## 2013-01-13 ENCOUNTER — Ambulatory Visit (INDEPENDENT_AMBULATORY_CARE_PROVIDER_SITE_OTHER): Payer: 59 | Admitting: Internal Medicine

## 2013-01-13 VITALS — BP 130/90 | HR 76 | Ht 68.0 in | Wt 191.0 lb

## 2013-01-13 DIAGNOSIS — R079 Chest pain, unspecified: Secondary | ICD-10-CM

## 2013-01-13 DIAGNOSIS — I10 Essential (primary) hypertension: Secondary | ICD-10-CM

## 2013-01-13 NOTE — Progress Notes (Signed)
HPI Patient is a 47 yo who is referred for evaluation of CP She is followed in Oncology for breast CA Patient has had occasional sharp pains  Felt like indigestion At night gets numb on L base on how sleeps Brother who is 88 had MI  He Smokes Dad with HTN  Rest of family not known  Does not get CP with phsical acitvity Occasionally gets dizzy   Has had after chemo.  No Known Allergies  Current Outpatient Prescriptions  Medication Sig Dispense Refill  . anastrozole (ARIMIDEX) 1 MG tablet TAKE 1 TABLET BY MOUTH EVERY DAY  30 tablet  5  . aspirin 81 MG tablet Take 81 mg by mouth daily.      . calcium citrate-vitamin D (CITRACAL+D) 315-200 MG-UNIT per tablet Take 1 tablet by mouth 2 (two) times daily.      Marland Kitchen gabapentin (NEURONTIN) 100 MG capsule Take 1 capsule (100 mg total) by mouth 3 (three) times daily.  90 capsule  5  . ibuprofen (ADVIL,MOTRIN) 200 MG tablet Take 400 mg by mouth every 6 (six) hours as needed. For ocassional headache.      . venlafaxine (EFFEXOR) 37.5 MG tablet TAKE 1 TABLET BY MOUTH EVERY DAY MAY INCREASE TO 2 TABLETS EVERY DAY  60 tablet  5   No current facility-administered medications for this visit.    Past Medical History  Diagnosis Date  . Anemia   . Breast cancer   . Hot flashes   . SVD (spontaneous vaginal delivery)     Past Surgical History  Procedure Laterality Date  . Appendectomy    . Mastectomy    . Breast enhancement surgery    . Transfer adjacent tissue trunk    . Cesarean section    . Laparoscopic assisted vaginal hysterectomy  06/09/2011    Procedure: LAPAROSCOPIC ASSISTED VAGINAL HYSTERECTOMY;  Surgeon: [name], MD;  Location: [facility];  Service: Gynecology;  Laterality: N/A;  . Salpingoophorectomy  06/09/2011    Procedure: SALPINGO OOPHERECTOMY;  Surgeon: [name], MD;  Location: [facility];  Service: Gynecology;  Laterality: Bilateral;  . Bladder suspension  06/09/2011    Procedure: TRANSVAGINAL TAPE (TVT) PROCEDURE;  Surgeon: [name], MD;  Location: [facility];  Service: Gynecology;  Laterality: N/A;  . Cystoscopy  06/09/2011    Procedure: CYSTOSCOPY;  Surgeon: [name], MD;  Location: [facility];  Service: Gynecology;  Laterality: N/A;    No family history on file.  History   Social History  . Marital Status: Married    Spouse Name: N/A    Number of Children: N/A  . Years of Education: N/A   Occupational History  . Not on file.   Social History Main Topics  . Smoking status: Never Smoker   . Smokeless tobacco: Not on file  . Alcohol Use: 7.2 oz/week    12 Cans of beer per week  . Drug Use: No  . Sexual Activity: Yes    Birth Control/ Protection: None     Comment: vasectomy   Other Topics Concern  . Not on file   Social History Narrative  . No narrative on file    Review of Systems:  All systems reviewed.  They are negative to the above problem except as previously stated.  Vital Signs: BP 130/90  Pulse 76  Ht 5\' 8"  (1.727 m)  Wt 191 lb (86.637 kg)  BMI 29.05 kg/m2  LMP 05/20/2011  Physical Exam Patient is in NAD  HEENT:  Normocephalic, atraumatic. EOMI, PERRLA.  Neck: JVP is normal.  No bruits.  Lungs: clear to auscultation. No rales no wheezes.  Heart: Regular rate and rhythm. Normal S1, S2. No S3.   No significant murmurs. PMI not displaced.  Abdomen:  Supple, nontender. Normal bowel sounds. No masses. No hepatomegaly.  Extremities:   Good distal pulses throughout. No lower extremity edema.  Musculoskeletal :moving all extremities.  Neuro:   alert and oriented x3.  CN II-XII grossly intact.  EKG  SR 76 Assessment and Plan:  1.  CP  Atypical  I do not think cardiac   Overall I think patient is at low risk  CT done 6 years ago had no comments of atherosclerotic plaquing  WIll review labs (lipids ) from Dr Gaye Alken offic   2.  HTN  BP is borderline high  Was 132/92 on my check  QUestion related to Effexor  She also is being evaluated for sleep apnea  Appt soon Will follow up with  this first before considering Rx  Encouraged her to exercise, lose wt  3  Breast CA  Followed by Dr Darnelle Catalan  4  Obesity  Counselled on wt loss.

## 2013-01-13 NOTE — Patient Instructions (Addendum)
Increase your walking  Your physician recommends that you continue on your current medications as directed. Please refer to the Current Medication list given to you today.'  Your physician wants you to follow-up in: 6 month ov You will receive a reminder letter in the mail two months in advance. If you don't receive a letter, please call our office to schedule the follow-up appointment.

## 2013-01-19 ENCOUNTER — Other Ambulatory Visit: Payer: Self-pay | Admitting: Oncology

## 2013-01-19 DIAGNOSIS — D493 Neoplasm of unspecified behavior of breast: Secondary | ICD-10-CM

## 2013-01-28 ENCOUNTER — Ambulatory Visit (INDEPENDENT_AMBULATORY_CARE_PROVIDER_SITE_OTHER): Payer: 59 | Admitting: Internal Medicine

## 2013-01-28 ENCOUNTER — Encounter: Payer: Self-pay | Admitting: Internal Medicine

## 2013-01-28 VITALS — BP 118/76 | HR 85 | Ht 67.75 in | Wt 193.8 lb

## 2013-01-28 DIAGNOSIS — Z23 Encounter for immunization: Secondary | ICD-10-CM

## 2013-01-28 DIAGNOSIS — G4733 Obstructive sleep apnea (adult) (pediatric): Secondary | ICD-10-CM

## 2013-01-28 NOTE — Patient Instructions (Signed)
Order- Split NPSG   Dx OSA  Flu vax

## 2013-01-28 NOTE — Progress Notes (Signed)
01/28/13- 47 yoF never smoker-Dr Magrinat-no sleep study; snores and stops breathing from what her husband has told her. Aware of loud snoring- husband leaves room. Toss and turn, unrestfull sleep. Hx of rhinitis in past. Some nasal congestion since last cold. No ENT surgery. Bedtime 10-11 PM, short latency to sleep onset, frequent waking during night before up 6:30-7AM.  Weight gain 20 lbs in 2 years. L breast cancer/ mastectomy/ chemo/ reconstruction 2009. Hx asthma.No hx heart, thyroid or HBP Mother OSA/ CPAP, Brother hx MI.  Married. Works Corporate treasurer with some travel.   Prior to Admission medications   Medication Sig Start Date End Date Taking? Authorizing Provider  anastrozole (ARIMIDEX) 1 MG tablet TAKE 1 TABLET BY MOUTH EVERY DAY 01/19/13  Yes Lowella Dell, MD  aspirin 81 MG tablet Take 81 mg by mouth daily.   Yes Historical Provider, MD  Biotin 10 MG CAPS Take by mouth.   Yes Historical Provider, MD  Cholecalciferol (VITAMIN D-3) 5000 UNITS TABS Take 1 capsule by mouth daily.   Yes Historical Provider, MD  gabapentin (NEURONTIN) 100 MG capsule TAKE 1 CAPSULE BY MOUTH THREE TIMES DAILY 01/19/13  Yes Lowella Dell, MD  ibuprofen (ADVIL,MOTRIN) 200 MG tablet Take 400 mg by mouth every 6 (six) hours as needed. For ocassional headache.   Yes Historical Provider, MD  venlafaxine (EFFEXOR) 37.5 MG tablet TAKE 1 TABLET BY MOUTH EVERY DAY MAY INCREASE TO 2 TABLETS BY MOUTH EVERY DAY 01/19/13  Yes Lowella Dell, MD   Past Medical History  Diagnosis Date  . Anemia   . Breast cancer   . Hot flashes   . SVD (spontaneous vaginal delivery)   . Allergic rhinitis   . Asthma    Past Surgical History  Procedure Laterality Date  . Appendectomy    . Mastectomy    . Breast enhancement surgery    . Transfer adjacent tissue trunk    . Cesarean section    . Laparoscopic assisted vaginal hysterectomy  06/09/2011    Procedure: LAPAROSCOPIC ASSISTED VAGINAL HYSTERECTOMY;  Surgeon:  [name], MD;  Location: [facility];  Service: Gynecology;  Laterality: N/A;  . Salpingoophorectomy  06/09/2011    Procedure: SALPINGO OOPHERECTOMY;  Surgeon: [name], MD;  Location: [facility];  Service: Gynecology;  Laterality: Bilateral;  . Bladder suspension  06/09/2011    Procedure: TRANSVAGINAL TAPE (TVT) PROCEDURE;  Surgeon: [name], MD;  Location: [facility];  Service: Gynecology;  Laterality: N/A;  . Cystoscopy  06/09/2011    Procedure: CYSTOSCOPY;  Surgeon: [name], MD;  Location: [facility];  Service: Gynecology;  Laterality: N/A;   Family History  Problem Relation Age of Onset  . Asthma Father     adult onset  . Heart attack Brother   . Hypertension Father   . Rheum arthritis Mother   . Rheum arthritis Father   . Leukemia Brother   . Kidney cancer Father   . Breast cancer Maternal Aunt   . Sleep apnea Mother     on CPAP   History   Social History  . Marital Status: Married    Spouse Name: N/A    Number of Children: 2  . Years of Education: N/A   Occupational History  . [employer]    Social History Main Topics  . Smoking status: Never Smoker   . Smokeless tobacco: Not on file  . Alcohol Use: 7.2 oz/week    12 Cans of beer  per week  . Drug Use: No  . Sexual Activity: Yes    Birth Control/ Protection: None     Comment: vasectomy   Other Topics Concern  . Not on file   Social History Narrative  . No narrative on file   ROS-see HPI Constitutional:   No-   weight loss, night sweats, fevers, chills,+ fatigue, lassitude. HEENT:   No-  headaches, difficulty swallowing, tooth/dental problems, sore throat,       No-  sneezing, itching, ear ache, nasal congestion, post nasal drip,  CV:  No-   chest pain, orthopnea, PND, swelling in lower extremities, anasarca,  dizziness, palpitations Resp: No-   shortness of breath with exertion or at rest.              No-   productive cough,  No non-productive cough,  No- coughing up of blood.              No-    change in color of mucus.  No- wheezing.   Skin: No-   rash or lesions. GI:  + indigestion, No- abdominal pain, nausea, vomiting, diarrhea,                 change in bowel habits, loss of appetite GU: No-   dysuria, change in color of urine, no urgency or frequency.  No- flank pain. MS:  No-   joint pain or swelling.  No- decreased range of motion.  No- back pain. Neuro-     nothing unusual Psych:  No- change in mood or affect. No depression or anxiety.  No memory loss.  OBJ- Physical Exam General- Alert, Oriented, Affect-appropriate, Distress- none acute, overweight Skin- rash-none, lesions- none, excoriation- none Lymphadenopathy- none Head- atraumatic            Eyes- Gross vision intact, PERRLA, conjunctivae and secretions clear            Ears- Hearing, canals-normal            Nose- Clear, no-Septal dev, mucus, polyps, erosion, perforation             Throat- Mallampati II-III , mucosa clear , drainage- none, tonsils- atrophic Neck- flexible , trachea midline, no stridor , thyroid nl, carotid no bruit Chest - symmetrical excursion , unlabored           Heart/CV- RRR , no murmur , no gallop  , no rub, nl s1 s2                           - JVD- none , edema- none, stasis changes- none, varices- none           Lung- clear to P&A, wheeze- none, cough- none , dullness-none, rub- none           Chest wall- L breast reconstruction Abd- tender-no, distended-no, bowel sounds-present, HSM- no Br/ Gen/ Rectal- Not done, not indicated Extrem- cyanosis- none, clubbing, none, atrophy- none, strength- nl Neuro- grossly intact to observation

## 2013-02-01 ENCOUNTER — Telehealth: Payer: Self-pay | Admitting: Internal Medicine

## 2013-02-01 DIAGNOSIS — G4733 Obstructive sleep apnea (adult) (pediatric): Secondary | ICD-10-CM | POA: Insufficient documentation

## 2013-02-01 NOTE — Telephone Encounter (Signed)
Spoke to pt she is aware of appt for sleep study 02/27/13 [name] [name]

## 2013-02-01 NOTE — Telephone Encounter (Signed)
Spoke with pt.  She is returning Dawn's call.  Please advise.  Thank you.

## 2013-02-01 NOTE — Assessment & Plan Note (Addendum)
Hx and exam are strongly consistent with OSA. Some past hx of asthma and indigestion. Possible that at least some of her symptoms reflect either nocturnal asthma or reflux, but I favor OSA as more probable explanation.  Plan- split protocol NPSG

## 2013-02-18 ENCOUNTER — Telehealth: Payer: Self-pay | Admitting: Emergency Medicine

## 2013-02-18 NOTE — Telephone Encounter (Signed)
Please advise PCC;s thanks 

## 2013-02-18 NOTE — Telephone Encounter (Signed)
Spoke to pt and she was given to auth# [facility] gave to [place] for the sleep study [id number] pt advised to contact [facility] and find out what this is al about [name]

## 2013-02-21 ENCOUNTER — Telehealth: Payer: Self-pay | Admitting: Internal Medicine

## 2013-02-21 DIAGNOSIS — G4733 Obstructive sleep apnea (adult) (pediatric): Secondary | ICD-10-CM

## 2013-02-21 NOTE — Telephone Encounter (Signed)
I spoke with pt. She reports she called her insurance and they advised they will approve home sleep study. If the doctor is wanting the study to be done in the lab then we will need to request a peer to peer. Please advise PCC's thanks

## 2013-02-22 NOTE — Telephone Encounter (Signed)
Aurther Loft returning [employer].  I informed Aurther Loft appt for split night study needed to be cancelled.  Nothing else needed. [facility]

## 2013-02-22 NOTE — Telephone Encounter (Signed)
i guess dr young needs to decide this [name]

## 2013-02-22 NOTE — Telephone Encounter (Signed)
Order placed for a home sleep test.  Pt aware.  She has a pending appt for a Split Night Study on 11/9.  Pt would like for [place] to cancel this appt. I have called the [facility] at [phone] to cancel this appt.  I had to [id number]

## 2013-02-22 NOTE — Telephone Encounter (Signed)
Ok to change order for sleep study to : Unattended home sleep study   Dx OSA

## 2013-02-22 NOTE — Telephone Encounter (Signed)
Please advise Dr. Young thanks 

## 2013-02-27 ENCOUNTER — Encounter (HOSPITAL_BASED_OUTPATIENT_CLINIC_OR_DEPARTMENT_OTHER): Payer: 59

## 2013-03-07 DIAGNOSIS — G4733 Obstructive sleep apnea (adult) (pediatric): Secondary | ICD-10-CM

## 2013-03-14 DIAGNOSIS — G4733 Obstructive sleep apnea (adult) (pediatric): Secondary | ICD-10-CM

## 2013-03-15 ENCOUNTER — Telehealth: Payer: Self-pay | Admitting: Internal Medicine

## 2013-03-15 ENCOUNTER — Encounter: Payer: Self-pay | Admitting: Internal Medicine

## 2013-03-15 NOTE — Telephone Encounter (Signed)
Pt advised and will come for OV on 03-31-13. Carron Curie, CMA

## 2013-03-15 NOTE — Telephone Encounter (Signed)
Pt is requesting home sleep study results. Please advise Dr. Maple Hudson thanks

## 2013-03-15 NOTE — Telephone Encounter (Signed)
She has obstructive sleep apnea. I will go over her test and discuss options when she comes for her scheduled appointment.

## 2013-03-31 ENCOUNTER — Encounter: Payer: Self-pay | Admitting: Internal Medicine

## 2013-03-31 ENCOUNTER — Ambulatory Visit (INDEPENDENT_AMBULATORY_CARE_PROVIDER_SITE_OTHER): Payer: 59 | Admitting: Internal Medicine

## 2013-03-31 VITALS — BP 118/60 | HR 93 | Ht 67.75 in | Wt 196.6 lb

## 2013-03-31 DIAGNOSIS — G4733 Obstructive sleep apnea (adult) (pediatric): Secondary | ICD-10-CM

## 2013-03-31 NOTE — Patient Instructions (Signed)
Order- [facility] refer to Dr Althea Grimmer DDS, Orthodontist   Consider oral appliance for OSA  We can help with a trial of CPAP if that is a better alternative

## 2013-03-31 NOTE — Progress Notes (Signed)
01/28/13- 47 yoF never smoker-Dr Magrinat-no sleep study; snores and stops breathing from what her husband has told her. Aware of loud snoring- husband leaves room. Toss and turn, unrestfull sleep. Hx of rhinitis in past. Some nasal congestion since last cold. No ENT surgery. Bedtime 10-11 PM, short latency to sleep onset, frequent waking during night before up 6:30-7AM.  Weight gain 20 lbs in 2 years. L breast cancer/ mastectomy/ chemo/ reconstruction 2009. Hx asthma.No hx heart, thyroid or HBP Mother OSA/ CPAP, Brother hx MI.  Married. Works Corporate treasurer with some travel.   03/31/13- 47 yoF never smoker-followed for obstructive sleep apnea, complicated by breast cancer FOLLOWS FOR: review home sleep study with patient. Unattended Home Sleep Study 03/07/13- AHI of 14.3 per hour (mild obstructive apnea), weight 193 pounds. We discussed medical issues, responsibility to drive safely, effect of body weight, treatment options. She actively wants to try a mouth piece and looks like a reasonable candidate for that approach.  ROS-see HPI Constitutional:   No-   weight loss, night sweats, fevers, chills,+ fatigue, lassitude. HEENT:   No-  headaches, difficulty swallowing, tooth/dental problems, sore throat,       No-  sneezing, itching, ear ache, nasal congestion, post nasal drip,  CV:  No-   chest pain, orthopnea, PND, swelling in lower extremities, anasarca,  dizziness, palpitations Resp: No-   shortness of breath with exertion or at rest.              No-   productive cough,  No non-productive cough,  No- coughing up of blood.              No-   change in color of mucus.  No- wheezing.   Skin: No-   rash or lesions. GI:   No acute concerns GU:  MS:  No-   joint pain or swelling.  . Neuro-     nothing unusual Psych:  No- change in mood or affect. No depression or anxiety.  No memory loss.  OBJ- Physical Exam General- Alert, Oriented, Affect-appropriate, Distress- none acute,  overweight Skin- rash-none, lesions- none, excoriation- none Lymphadenopathy- none Head- atraumatic            Eyes- Gross vision intact, PERRLA, conjunctivae and secretions clear            Ears- Hearing, canals-normal            Nose- Clear, no-Septal dev, mucus, polyps, erosion, perforation             Throat- Mallampati II-III , mucosa clear , drainage- none, tonsils- atrophic Neck- flexible , trachea midline, no stridor , thyroid nl, carotid no bruit Chest - symmetrical excursion , unlabored           Heart/CV- RRR , no murmur , no gallop  , no rub, nl s1 s2                           - JVD- none , edema- none, stasis changes- none, varices- none           Lung- clear to P&A, wheeze- none, cough- none , dullness-none, rub- none           Chest wall- L breast reconstruction Abd-  Br/ Gen/ Rectal- Not done, not indicated Extrem- cyanosis- none, clubbing, none, atrophy- none, strength- nl Neuro- grossly intact to observation

## 2013-04-21 NOTE — Assessment & Plan Note (Signed)
She chooses to try an oral appliance first for management of her mild OSA. We have discussed conservative management options. Plan-referral for oral appliance

## 2013-06-01 ENCOUNTER — Telehealth: Payer: Self-pay | Admitting: *Deleted

## 2013-06-02 ENCOUNTER — Other Ambulatory Visit (HOSPITAL_BASED_OUTPATIENT_CLINIC_OR_DEPARTMENT_OTHER): Payer: 59

## 2013-06-02 ENCOUNTER — Ambulatory Visit (HOSPITAL_BASED_OUTPATIENT_CLINIC_OR_DEPARTMENT_OTHER): Payer: 59 | Admitting: Hematology and Oncology

## 2013-06-02 ENCOUNTER — Telehealth: Payer: Self-pay | Admitting: Oncology

## 2013-06-02 ENCOUNTER — Other Ambulatory Visit: Payer: Self-pay | Admitting: *Deleted

## 2013-06-02 VITALS — BP 145/90 | HR 83 | Temp 98.5°F | Resp 18 | Ht 67.75 in | Wt 198.2 lb

## 2013-06-02 DIAGNOSIS — Z170 Estrogen receptor positive status [ER+]: Secondary | ICD-10-CM

## 2013-06-02 DIAGNOSIS — C50919 Malignant neoplasm of unspecified site of unspecified female breast: Secondary | ICD-10-CM

## 2013-06-02 DIAGNOSIS — R7989 Other specified abnormal findings of blood chemistry: Secondary | ICD-10-CM

## 2013-06-02 DIAGNOSIS — C50219 Malignant neoplasm of upper-inner quadrant of unspecified female breast: Secondary | ICD-10-CM

## 2013-06-02 DIAGNOSIS — D493 Neoplasm of unspecified behavior of breast: Secondary | ICD-10-CM

## 2013-06-02 DIAGNOSIS — F1020 Alcohol dependence, uncomplicated: Secondary | ICD-10-CM

## 2013-06-02 LAB — CBC WITH DIFFERENTIAL/PLATELET
BASO%: 0.4 % (ref 0.0–2.0)
Basophils Absolute: 0 10*3/uL (ref 0.0–0.1)
EOS ABS: 0.3 10*3/uL (ref 0.0–0.5)
EOS%: 4.3 % (ref 0.0–7.0)
HCT: 40.1 % (ref 34.8–46.6)
HGB: 13.8 g/dL (ref 11.6–15.9)
LYMPH%: 18.6 % (ref 14.0–49.7)
MCH: 32.1 pg (ref 25.1–34.0)
MCHC: 34.3 g/dL (ref 31.5–36.0)
MCV: 93.3 fL (ref 79.5–101.0)
MONO#: 0.8 10*3/uL (ref 0.1–0.9)
MONO%: 10.9 % (ref 0.0–14.0)
NEUT#: 4.9 10*3/uL (ref 1.5–6.5)
NEUT%: 65.8 % (ref 38.4–76.8)
Platelets: 211 10*3/uL (ref 145–400)
RBC: 4.3 10*6/uL (ref 3.70–5.45)
RDW: 12.5 % (ref 11.2–14.5)
WBC: 7.4 10*3/uL (ref 3.9–10.3)
lymph#: 1.4 10*3/uL (ref 0.9–3.3)

## 2013-06-02 LAB — COMPREHENSIVE METABOLIC PANEL (CC13)
ALT: 94 U/L — ABNORMAL HIGH (ref 0–55)
AST: 88 U/L — ABNORMAL HIGH (ref 5–34)
Albumin: 4.1 g/dL (ref 3.5–5.0)
Alkaline Phosphatase: 109 U/L (ref 40–150)
Anion Gap: 10 mEq/L (ref 3–11)
BILIRUBIN TOTAL: 0.42 mg/dL (ref 0.20–1.20)
BUN: 10.6 mg/dL (ref 7.0–26.0)
CO2: 28 mEq/L (ref 22–29)
Calcium: 9.7 mg/dL (ref 8.4–10.4)
Chloride: 102 mEq/L (ref 98–109)
Creatinine: 1 mg/dL (ref 0.6–1.1)
GLUCOSE: 121 mg/dL (ref 70–140)
Potassium: 3.8 mEq/L (ref 3.5–5.1)
SODIUM: 140 meq/L (ref 136–145)
TOTAL PROTEIN: 7.3 g/dL (ref 6.4–8.3)

## 2013-06-02 NOTE — Progress Notes (Signed)
ID: [name] [name] OB: 09/24/1965  MR#: [id number]  EHM#:[id number]  PCP: No PCP Per Patient GYN:  Arvella Nigh SU:  Star Age OTHER MD: Arloa Koh, Crissie Reese  Diagnosis: Left breast CA  Chief complaint: Breast cancer surveillance visit  HISTORY OF PRESENT ILLNESS: From Dr. Rada Hay intake node 02/16/2008:  "This woman has had annual screening mammography.  She questioned the presence of a mass last year and had mammograms, which were negative.  She ultimately had a followup screening mammogram in July 2009, which showed a possible mass in the left breast.  The patient had a biopsy of the left breast lesion on 11/17/2007, which showed a lobular type cancer, ER and PR positive at 82% and 100% respectively proliferative index 16%, HER-2 was 0.  She had a followup MRI scan on 11/24/2007, which showed three left breast masses, one was a 2.4 x 2 x 2.1 spiculated enhancing mass at 11 o'clock position, which had a post biopsy clip.  A second lesion was in the lower inner quadrant left breast 8.30 position, which was spiculated.  A third mass in the 2.30 position measuring 1.1 x 1 x 0.9 cm was also noted.  The patient was referred for an additional ultrasound and these two other lesions were identified and so a separate biopsy was performed of the lesion at 7.30 on 11/25/2007 and this did in fact show a grade 1 of 3 lobular type cancer.  This was ER and PR positive at 93% and 100% respectively, proliferative index 6%, HER-2 was 2+, and with CISH analysis, this proved to be amplified at a ratio of 2.  Because of the multicentric nature of her cancer, the patient and the surgeon elected to undergo a mastectomy on 01/24/2008 with intention to do reconstruction.  The final pathology showed two separate lesions measuring 2.3 and 1.5 cm.  All margins were clear.  Two sentinel lymph nodes were negative for malignancy."  Her subsequent history is as detailed below  INTERVAL HISTORY: [name] returns today for  followup of her breast cancer.  She is completing her last dose of anastrozole tomorrow. She tolerated anastrozole very well with minimal side effects hot flashes controlled with gabapentin and venlafaxine. Her last mammogram was performed in August 2014 and there was no mammographic evidence of any malignancy noted. She denies any weight loss or decrease in appetite, no fever, no shortness of breath, no constipation, no blood in the stool , no blood in the urine. Does not complain of any blurred vision, dizziness,  Headaches. She says that she drinks beer almost daily  for the past few months. She does not smoke cigarettes  REVIEW OF SYSTEMS: A 14 point review of systems is been assessed and pertinent symptoms were mentioned in interval history  PAST MEDICAL HISTORY: Past Medical History  Diagnosis Date  . Anemia   . Breast cancer   . Hot flashes   . SVD (spontaneous vaginal delivery)   . Allergic rhinitis   . Asthma     PAST SURGICAL HISTORY: Past Surgical History  Procedure Laterality Date  . Appendectomy    . Mastectomy    . Breast enhancement surgery    . Transfer adjacent tissue trunk    . Cesarean section    . Laparoscopic assisted vaginal hysterectomy  06/09/2011    Procedure: LAPAROSCOPIC ASSISTED VAGINAL HYSTERECTOMY;  Surgeon: Darlyn Chamber, MD;  Location: [facility];  Service: Gynecology;  Laterality: N/A;  . Salpingoophorectomy  06/09/2011  Procedure: SALPINGO OOPHERECTOMY;  Surgeon: Darlyn Chamber, MD;  Location: [facility];  Service: Gynecology;  Laterality: Bilateral;  . Bladder suspension  06/09/2011    Procedure: TRANSVAGINAL TAPE (TVT) PROCEDURE;  Surgeon: Darlyn Chamber, MD;  Location: [facility];  Service: Gynecology;  Laterality: N/A;  . Cystoscopy  06/09/2011    Procedure: CYSTOSCOPY;  Surgeon: Darlyn Chamber, MD;  Location: [facility];  Service: Gynecology;  Laterality: N/A;    FAMILY HISTORY Family History  Problem Relation Age of Onset  . Asthma Father     adult onset  .  Heart attack Brother   . Hypertension Father   . Rheum arthritis Mother   . Rheum arthritis Father   . Leukemia Brother   . Kidney cancer Father   . Breast cancer Maternal Aunt   . Sleep apnea Mother     on CPAP   Both the patient's parents are alive, in their 77s. The patient has 3 brothers, no sisters. The only breast cancer in the family it was 1 of the patient's 4 maternal aunts. She is not sure what age she was diagnosed. There is no history of ovarian cancer in the family.  GYNECOLOGIC HISTORY:  Menarche age 65, first live birth age 74, she is [place] P2. Her most recent menstrual period was January 2014, at which time she underwent her hysterectomy and bilateral salpingo-oophorectomy (SZ D. [phone])  SOCIAL HISTORY:  She works in Nurse, learning disability. She has 2 children from her first marriage a 28 year old daughter currently living with her (this daughter has a 62-year-old son); she is studying to be a Charity fundraiser. The patient also has a 57 year old son who is in the [employer], currently stationed in [place]. The patient's a second husband, Gwyndolyn Saxon, works for [employer]. He has 2 children of his own, a stepson, Larkin Ina a, who has a Sport and exercise psychologist; and a Psychiatrist, Noah Delaine, 50 years old. At home it to the patient, her husband, the patient's daughter, and her husband Lorenza Chick. The patient is not a church attender    ADVANCED DIRECTIVES: Not in place   HEALTH MAINTENANCE: History  Substance Use Topics  . Smoking status: Never Smoker   . Smokeless tobacco: Not on file  . Alcohol Use: 7.2 oz/week    12 Cans of beer per week     Colonoscopy:  PAP:  Bone density:  Lipid panel:  No Known Allergies  Current Outpatient Prescriptions  Medication Sig Dispense Refill  . anastrozole (ARIMIDEX) 1 MG tablet TAKE 1 TABLET BY MOUTH EVERY DAY  30 tablet  4  . aspirin 81 MG tablet Take 81 mg by mouth daily.      . Cholecalciferol (VITAMIN D-3) 5000 UNITS TABS Take 1 capsule by mouth daily.      Marland Kitchen  gabapentin (NEURONTIN) 100 MG capsule TAKE 1 CAPSULE BY MOUTH THREE TIMES DAILY  90 capsule  4  . venlafaxine (EFFEXOR) 37.5 MG tablet TAKE 1 TABLET BY MOUTH EVERY DAY MAY INCREASE TO 2 TABLETS BY MOUTH EVERY DAY  60 tablet  4  . Biotin 10 MG CAPS Take by mouth.      Marland Kitchen ibuprofen (ADVIL,MOTRIN) 200 MG tablet Take 400 mg by mouth every 6 (six) hours as needed. For ocassional headache.       No current facility-administered medications for this visit.    OBJECTIVE: Young white woman who appears stated age 6 Vitals:   06/02/13 1527  BP: 145/90  Pulse: 83  Temp: 98.5 F (36.9 C)  Resp: 18     Body mass index is 30.35 kg/(m^2).    ECOG FS: 0  HEENT PERRLA, sclerae anicteric, conjunctiva no pallor neck supple no JVD no thyromegaly no lymphadenopathy appreciated  Oropharynx clear No cervical or supraclavicular adenopathy Lungs no rales or rhonchi Heart regular rate and rhythm, no murmur appreciated Abd benign MSK no focal spinal tenderness, no peripheral edema Neuro: non-focal, well-oriented, appropriate affect Breasts: The right breast is unremarkable. The left breast is status post mastectomy and TRAM reconstruction. There is no evidence of local recurrence. The left axilla is benign. Extremities no edema no cyanosis no clubbing Skin :intact   LAB RESULTS:  CMP     Component Value Date/Time   NA 140 06/02/2013 1516   NA 139 12/04/2011 0838   K 3.8 06/02/2013 1516   K 4.5 12/04/2011 0838   CL 103 12/04/2011 0838   CO2 28 06/02/2013 1516   CO2 26 12/04/2011 0838   GLUCOSE 121 06/02/2013 1516   GLUCOSE 80 12/04/2011 0838   BUN 10.6 06/02/2013 1516   BUN 14 12/04/2011 0838   CREATININE 1.0 06/02/2013 1516   CREATININE 0.92 12/04/2011 0838   CALCIUM 9.7 06/02/2013 1516   CALCIUM 9.6 12/04/2011 0838   PROT 7.3 06/02/2013 1516   PROT 7.5 12/04/2011 0838   ALBUMIN 4.1 06/02/2013 1516   ALBUMIN 4.6 12/04/2011 0838   AST 88* 06/02/2013 1516   AST 31 12/04/2011 0838   ALT 94* 06/02/2013 1516    ALT 43* 12/04/2011 0838   ALKPHOS 109 06/02/2013 1516   ALKPHOS 97 12/04/2011 0838   BILITOT 0.42 06/02/2013 1516   BILITOT 0.4 12/04/2011 0838   GFRNONAA >60 06/27/2008 0840   GFRAA  Value: >60        The eGFR has been calculated using the MDRD equation. This calculation has not been validated in all clinical situations. eGFR's persistently <60 mL/min signify possible Chronic Kidney Disease. 06/27/2008 0840    I No results found for this basename: SPEP,  UPEP,   kappa and lambda light chains    Lab Results  Component Value Date   WBC 7.4 06/02/2013   NEUTROABS 4.9 06/02/2013   HGB 13.8 06/02/2013   HCT 40.1 06/02/2013   MCV 93.3 06/02/2013   PLT 211 06/02/2013      Chemistry      Component Value Date/Time   NA 140 06/02/2013 1516   NA 139 12/04/2011 0838   K 3.8 06/02/2013 1516   K 4.5 12/04/2011 0838   CL 103 12/04/2011 0838   CO2 28 06/02/2013 1516   CO2 26 12/04/2011 0838   BUN 10.6 06/02/2013 1516   BUN 14 12/04/2011 0838   CREATININE 1.0 06/02/2013 1516   CREATININE 0.92 12/04/2011 0838      Component Value Date/Time   CALCIUM 9.7 06/02/2013 1516   CALCIUM 9.6 12/04/2011 0838   ALKPHOS 109 06/02/2013 1516   ALKPHOS 97 12/04/2011 0838   AST 88* 06/02/2013 1516   AST 31 12/04/2011 0838   ALT 94* 06/02/2013 1516   ALT 43* 12/04/2011 0838   BILITOT 0.42 06/02/2013 1516   BILITOT 0.4 12/04/2011 0838       Lab Results  Component Value Date   LABCA2 24 12/04/2011    No components found with this basename: [id number]    No results found for this basename: INR,  in the last 168 hours  Urinalysis    Component Value Date/Time   COLORURINE YELLOW 01/20/2008 [place]  CLEAR 01/20/2008 1418   LABSPEC 1.011 01/20/2008 1418   PHURINE 6.5 01/20/2008 1418   GLUCOSEU NEGATIVE 01/20/2008 1418   HGBUR MODERATE* 01/20/2008 [place] 01/20/2008 [place] 01/20/2008 [place] 01/20/2008 1418   UROBILINOGEN 0.2 01/20/2008 1418   NITRITE NEGATIVE  01/20/2008 [place] 01/20/2008 1418    STUDIES: Mm Digital Screening Unilat R  12/03/2012   *RADIOLOGY REPORT*  Clinical Data: Screening.  DIGITAL SCREENING UNILATERAL RIGHT MAMMOGRAM WITH CAD  Comparison:  Previous exam(s).  FINDINGS:  ACR Breast Density Category d:  The breast tissue is extremely dense, which lowers the sensitivity of mammography.  There are no findings suspicious for malignancy. Right retropectoral saline implant in place.  Images were processed with CAD.  IMPRESSION: No mammographic evidence of malignancy.  A result letter of this screening mammogram will be mailed directly to the patient.  RECOMMENDATION: Screening mammogram in one year. (Code:SM-B-01Y)  BI-RADS CATEGORY 1:  Negative.   Original Report Authenticated By: Lovey Newcomer, M.D    ASSESSMENT/PLAN: 48 y.o. [place]   (1) status post left mastectomy and sentinel lymph node sampling 01/24/2008 for an mpT2 pN0, stage IIA invasive lobular breast cancer, grade 1, estrogen receptor 93% positive, progesterone receptor 100% positive, MIB-16%, and HER-2 amplified by CISH with a ratio of 2.02  (2) Oncotype DX score of 18 predicted a distant recurrence within the next 10 years of 18% if the patient's only systemic treatment was tamoxifen for 5 years. HER-2 was negative on this study.  (3) Adjuvant chemotherapy consisted of cyclophosphamide and docetaxel x4, completed January of 2010  (4) adjuvant anti-estrogen therapy consisted of tamoxifen between February of 2010 and February of 2013, and anastrozole started at that point  (5) status post TAH BSO with benign pathology 06/09/2011  (6) Elevation in liver functions most likely secondary to alcohol intake versus rule out breast cancer recurrence: I've asked the patient to stop alcohol intake and we'll repeat the liver functions in 3 weeks. We will arrange for CT of the abdomen  to rule out any liver metastasis.  (7) If the  CT of the abdomen shows no  metastasis, since she has completed 5 years of antiestrogen therapy we will transit the care to her primary care physician   (8) counseled on stop drinking alcohol. We'll schedule for repeat liver functions in 3 weeks  (9) Her next annual digital mammogram is scheduled in August 2015   Follow up in April, 2015 with repeat CMP and to review CAT scan reports   Ms. Chawla is in agreement with the current plan of care. She knows to call us with any questions or concerns prior to her next visit   Wilmon Arms, MD  Medical oncology   06/02/2013 6:45 PM

## 2013-06-02 NOTE — Telephone Encounter (Signed)
, °

## 2013-06-10 ENCOUNTER — Other Ambulatory Visit: Payer: Self-pay | Admitting: *Deleted

## 2013-06-10 DIAGNOSIS — R7989 Other specified abnormal findings of blood chemistry: Secondary | ICD-10-CM

## 2013-06-10 DIAGNOSIS — R945 Abnormal results of liver function studies: Secondary | ICD-10-CM

## 2013-06-10 DIAGNOSIS — D493 Neoplasm of unspecified behavior of breast: Secondary | ICD-10-CM

## 2013-06-14 ENCOUNTER — Ambulatory Visit (HOSPITAL_COMMUNITY)
Admission: RE | Admit: 2013-06-14 | Discharge: 2013-06-14 | Disposition: A | Discharge status: Home / Self Care | Payer: 59 | Source: Ambulatory Visit | Attending: Hematology and Oncology | Admitting: Hematology and Oncology

## 2013-06-14 ENCOUNTER — Encounter (HOSPITAL_COMMUNITY): Payer: Self-pay

## 2013-06-14 DIAGNOSIS — Z853 Personal history of malignant neoplasm of breast: Secondary | ICD-10-CM | POA: Insufficient documentation

## 2013-06-14 DIAGNOSIS — D493 Neoplasm of unspecified behavior of breast: Secondary | ICD-10-CM

## 2013-06-14 DIAGNOSIS — J45909 Unspecified asthma, uncomplicated: Secondary | ICD-10-CM | POA: Insufficient documentation

## 2013-06-14 DIAGNOSIS — Z9010 Acquired absence of unspecified breast and nipple: Secondary | ICD-10-CM | POA: Insufficient documentation

## 2013-06-14 DIAGNOSIS — M549 Dorsalgia, unspecified: Secondary | ICD-10-CM | POA: Insufficient documentation

## 2013-06-14 DIAGNOSIS — K7689 Other specified diseases of liver: Secondary | ICD-10-CM | POA: Insufficient documentation

## 2013-06-14 DIAGNOSIS — R945 Abnormal results of liver function studies: Secondary | ICD-10-CM | POA: Insufficient documentation

## 2013-06-14 DIAGNOSIS — R7989 Other specified abnormal findings of blood chemistry: Secondary | ICD-10-CM

## 2013-06-14 MED ORDER — IOHEXOL 300 MG/ML  SOLN
100.0000 mL | Freq: Once | INTRAMUSCULAR | Status: AC | PRN
  Administered 2013-06-14: 100 mL via INTRAVENOUS

## 2013-06-22 NOTE — Telephone Encounter (Signed)
Pt called to cancel her labs for 06/23/13@ 8am....td

## 2013-06-23 ENCOUNTER — Other Ambulatory Visit: Payer: 59

## 2013-06-23 ENCOUNTER — Other Ambulatory Visit: Payer: Self-pay | Admitting: *Deleted

## 2013-07-01 ENCOUNTER — Telehealth: Payer: Self-pay | Admitting: *Deleted

## 2013-07-01 NOTE — Telephone Encounter (Signed)
Pt called stating that she cant attend her appts on 08/02/13 and wanted to rs. gv appt for 08/25/13 w/ labs @9am  and ov@ 9:30am. Pt is aware...td

## 2013-07-26 ENCOUNTER — Other Ambulatory Visit: Payer: Self-pay | Admitting: Oncology

## 2013-07-26 DIAGNOSIS — C50919 Malignant neoplasm of unspecified site of unspecified female breast: Secondary | ICD-10-CM

## 2013-07-26 DIAGNOSIS — D493 Neoplasm of unspecified behavior of breast: Secondary | ICD-10-CM

## 2013-08-02 ENCOUNTER — Other Ambulatory Visit: Payer: 59

## 2013-08-02 ENCOUNTER — Ambulatory Visit: Payer: 59 | Admitting: Oncology

## 2013-08-05 ENCOUNTER — Telehealth: Payer: Self-pay | Admitting: Oncology

## 2013-08-05 NOTE — Telephone Encounter (Signed)
per pof GM to move pt appt/labs to cover 2 same time & date

## 2013-08-24 ENCOUNTER — Other Ambulatory Visit: Payer: Self-pay | Admitting: *Deleted

## 2013-08-24 DIAGNOSIS — D493 Neoplasm of unspecified behavior of breast: Secondary | ICD-10-CM

## 2013-08-25 ENCOUNTER — Encounter: Payer: 59 | Admitting: Oncology

## 2013-08-25 ENCOUNTER — Other Ambulatory Visit: Payer: 59

## 2013-08-25 ENCOUNTER — Other Ambulatory Visit (HOSPITAL_BASED_OUTPATIENT_CLINIC_OR_DEPARTMENT_OTHER): Payer: 59

## 2013-08-25 ENCOUNTER — Ambulatory Visit (HOSPITAL_BASED_OUTPATIENT_CLINIC_OR_DEPARTMENT_OTHER): Payer: 59 | Admitting: Hematology and Oncology

## 2013-08-25 ENCOUNTER — Telehealth: Payer: Self-pay | Admitting: Oncology

## 2013-08-25 VITALS — BP 144/92 | HR 81 | Temp 98.8°F | Resp 18 | Ht 67.0 in | Wt 197.8 lb

## 2013-08-25 DIAGNOSIS — R7989 Other specified abnormal findings of blood chemistry: Secondary | ICD-10-CM

## 2013-08-25 DIAGNOSIS — D493 Neoplasm of unspecified behavior of breast: Secondary | ICD-10-CM

## 2013-08-25 DIAGNOSIS — F1020 Alcohol dependence, uncomplicated: Secondary | ICD-10-CM

## 2013-08-25 DIAGNOSIS — Z853 Personal history of malignant neoplasm of breast: Secondary | ICD-10-CM

## 2013-08-25 DIAGNOSIS — C50919 Malignant neoplasm of unspecified site of unspecified female breast: Secondary | ICD-10-CM

## 2013-08-25 DIAGNOSIS — K7689 Other specified diseases of liver: Secondary | ICD-10-CM

## 2013-08-25 DIAGNOSIS — R599 Enlarged lymph nodes, unspecified: Secondary | ICD-10-CM

## 2013-08-25 LAB — COMPREHENSIVE METABOLIC PANEL (CC13)
ALT: 68 U/L — ABNORMAL HIGH (ref 0–55)
AST: 51 U/L — ABNORMAL HIGH (ref 5–34)
Albumin: 3.9 g/dL (ref 3.5–5.0)
Alkaline Phosphatase: 109 U/L (ref 40–150)
Anion Gap: 11 mEq/L (ref 3–11)
BUN: 12.6 mg/dL (ref 7.0–26.0)
CO2: 22 mEq/L (ref 22–29)
Calcium: 9.4 mg/dL (ref 8.4–10.4)
Chloride: 106 mEq/L (ref 98–109)
Creatinine: 1 mg/dL (ref 0.6–1.1)
GLUCOSE: 155 mg/dL — AB (ref 70–140)
POTASSIUM: 4.3 meq/L (ref 3.5–5.1)
Sodium: 139 mEq/L (ref 136–145)
Total Bilirubin: 0.39 mg/dL (ref 0.20–1.20)
Total Protein: 7.4 g/dL (ref 6.4–8.3)

## 2013-08-25 NOTE — Telephone Encounter (Signed)
, °

## 2013-08-25 NOTE — Progress Notes (Signed)
ID: Barnie Mort OB: 1966/04/07  MR#: [id number]  OBS#:[id number]  PCP: No PCP Per Patient GYN:  Arvella Nigh SU:  Star Age OTHER MD: Arloa Koh, Crissie Reese  Diagnosis: Left breast CA  Chief complaint: Breast cancer follow up visit  HISTORY OF PRESENT ILLNESS: From Dr. Rada Hay intake node 02/16/2008:  "This woman has had annual screening mammography.  She questioned the presence of a mass last year and had mammograms, which were negative.  She ultimately had a followup screening mammogram in July 2009, which showed a possible mass in the left breast.  The patient had a biopsy of the left breast lesion on 11/17/2007, which showed a lobular type cancer, ER and PR positive at 82% and 100% respectively proliferative index 16%, HER-2 was 0.  She had a followup MRI scan on 11/24/2007, which showed three left breast masses, one was a 2.4 x 2 x 2.1 spiculated enhancing mass at 11 o'clock position, which had a post biopsy clip.  A second lesion was in the lower inner quadrant left breast 8.30 position, which was spiculated.  A third mass in the 2.30 position measuring 1.1 x 1 x 0.9 cm was also noted.  The patient was referred for an additional ultrasound and these two other lesions were identified and so a separate biopsy was performed of the lesion at 7.30 on 11/25/2007 and this did in fact show a grade 1 of 3 lobular type cancer.  This was ER and PR positive at 93% and 100% respectively, proliferative index 6%, HER-2 was 2+, and with CISH analysis, this proved to be amplified at a ratio of 2.  Because of the multicentric nature of her cancer, the patient and the surgeon elected to undergo a mastectomy on 01/24/2008 with intention to do reconstruction.  The final pathology showed two separate lesions measuring 2.3 and 1.5 cm.  All margins were clear.  Two sentinel lymph nodes were negative for malignancy."  Her subsequent history is as detailed below  INTERVAL HISTORY: Lanisha returns today for  followup of visit for her breast cancer.  She completed  anastrozole therapy on 06/03/2013. During the last visit she was found to have progressively elevated liver functions and we have ordered the CAT scan. CAT scan of the abdomen which was performed on 06/14/2013 revealed fatty infiltration of liver and nonspecific 14/11 mm right lobe liver nodule and was recommended to have the MRI of the abdomen to further delineate the process. At present she says that she did not cut down on her alcohol intake. In a typical week  she drinks about 12 beers. She does not smoke. She denies any weight loss or decrease in appetite, no fever, no shortness of breath, no constipation, no blood in the stool , no blood in the urine. Does not complain of any blurred vision, dizziness,  Headaches.    REVIEW OF SYSTEMS: A 14 point review of systems is been assessed and pertinent symptoms were mentioned in interval history  PAST MEDICAL HISTORY: Past Medical History  Diagnosis Date  . Anemia   . Hot flashes   . SVD (spontaneous vaginal delivery)   . Allergic rhinitis   . Asthma   . Breast cancer dx'd 10/2007    PAST SURGICAL HISTORY: Past Surgical History  Procedure Laterality Date  . Appendectomy    . Mastectomy    . Breast enhancement surgery    . Transfer adjacent tissue trunk    . Cesarean section    . Laparoscopic  assisted vaginal hysterectomy  06/09/2011    Procedure: LAPAROSCOPIC ASSISTED VAGINAL HYSTERECTOMY;  Surgeon: Darlyn Chamber, MD;  Location: [facility];  Service: Gynecology;  Laterality: N/A;  . Salpingoophorectomy  06/09/2011    Procedure: SALPINGO OOPHERECTOMY;  Surgeon: Darlyn Chamber, MD;  Location: [facility];  Service: Gynecology;  Laterality: Bilateral;  . Bladder suspension  06/09/2011    Procedure: TRANSVAGINAL TAPE (TVT) PROCEDURE;  Surgeon: Darlyn Chamber, MD;  Location: [facility];  Service: Gynecology;  Laterality: N/A;  . Cystoscopy  06/09/2011    Procedure: CYSTOSCOPY;  Surgeon: Darlyn Chamber,  MD;  Location: [facility];  Service: Gynecology;  Laterality: N/A;    FAMILY HISTORY Family History  Problem Relation Age of Onset  . Asthma Father     adult onset  . Heart attack Brother   . Hypertension Father   . Rheum arthritis Mother   . Rheum arthritis Father   . Leukemia Brother   . Kidney cancer Father   . Breast cancer Maternal Aunt   . Sleep apnea Mother     on CPAP   Both the patient's parents are alive, in their 41s. The patient has 3 brothers, no sisters. The only breast cancer in the family it was 1 of the patient's 4 maternal aunts. She is not sure what age she was diagnosed. There is no history of ovarian cancer in the family.  GYNECOLOGIC HISTORY:  Menarche age 68, first live birth age 32, she is [place] P2. Her most recent menstrual period was January 2014, at which time she underwent her hysterectomy and bilateral salpingo-oophorectomy (SZ D. [phone])  SOCIAL HISTORY:  She works in Nurse, learning disability. She has 2 children from her first marriage a 60 year old daughter currently living with her (this daughter has a 55-year-old son); she is studying to be a Charity fundraiser. The patient also has a 58 year old son who is in the [employer], currently stationed in [place]. The patient's a second husband, Gwyndolyn Saxon, works for [employer]. He has 2 children of his own, a stepson, Larkin Ina a, who has a Sport and exercise psychologist; and a Psychiatrist, Noah Delaine, 50 years old. At home it to the patient, her husband, the patient's daughter, and her husband Lorenza Chick. The patient is not a church attender    ADVANCED DIRECTIVES: Not in place   HEALTH MAINTENANCE: History  Substance Use Topics  . Smoking status: Never Smoker   . Smokeless tobacco: Not on file  . Alcohol Use: 7.2 oz/week    12 Cans of beer per week     Colonoscopy:  PAP:  Bone density:  Lipid panel:  No Known Allergies  Current Outpatient Prescriptions  Medication Sig Dispense Refill  . aspirin 81 MG tablet Take 81 mg by mouth daily.      .  Cholecalciferol (VITAMIN D-3) 5000 UNITS TABS Take 1 capsule by mouth daily.      Marland Kitchen gabapentin (NEURONTIN) 100 MG capsule TAKE 1 CAPSULE BY MOUTH THREE TIMES DAILY  270 capsule  1  . ibuprofen (ADVIL,MOTRIN) 200 MG tablet Take 400 mg by mouth every 6 (six) hours as needed. For ocassional headache.      . venlafaxine (EFFEXOR) 37.5 MG tablet TAKE 1 TABLET BY MOUTH EVERY DAY. MAY INCREASE TO 2 TABLETS EVERY DAY  180 tablet  0   No current facility-administered medications for this visit.    OBJECTIVE: Young white woman who appears stated age 22 Vitals:   08/25/13 0842  BP: 144/92  Pulse: 81  Temp: 98.8  F (37.1 C)  Resp: 18     Body mass index is 30.97 kg/(m^2).    ECOG FS: 0  HEENT PERRLA, sclerae anicteric, conjunctiva no pallor neck supple no JVD no thyromegaly no lymphadenopathy appreciated  Oropharynx clear No cervical or supraclavicular adenopathy Lungs no rales or rhonchi Heart regular rate and rhythm, no murmur appreciated Abd benign MSK no focal spinal tenderness, no peripheral edema Neuro: non-focal, well-oriented, appropriate affect Breasts: The right breast is unremarkable. The left breast is status post mastectomy and TRAM reconstruction. There is no evidence of local recurrence. The left axilla is benign. Extremities no edema no cyanosis no clubbing Skin :intact   LAB RESULTS:  CMP     Component Value Date/Time   NA 139 08/25/2013 0828   NA 139 12/04/2011 0838   K 4.3 08/25/2013 0828   K 4.5 12/04/2011 0838   CL 103 12/04/2011 0838   CO2 22 08/25/2013 0828   CO2 26 12/04/2011 0838   GLUCOSE 155* 08/25/2013 0828   GLUCOSE 80 12/04/2011 0838   BUN 12.6 08/25/2013 0828   BUN 14 12/04/2011 0838   CREATININE 1.0 08/25/2013 0828   CREATININE 0.92 12/04/2011 0838   CALCIUM 9.4 08/25/2013 0828   CALCIUM 9.6 12/04/2011 0838   PROT 7.4 08/25/2013 0828   PROT 7.5 12/04/2011 0838   ALBUMIN 3.9 08/25/2013 0828   ALBUMIN 4.6 12/04/2011 0838   AST 51* 08/25/2013 0828   AST 31 12/04/2011 0838    ALT 68* 08/25/2013 0828   ALT 43* 12/04/2011 0838   ALKPHOS 109 08/25/2013 0828   ALKPHOS 97 12/04/2011 0838   BILITOT 0.39 08/25/2013 0828   BILITOT 0.4 12/04/2011 0838   GFRNONAA >60 06/27/2008 0840   GFRAA  Value: >60        The eGFR has been calculated using the MDRD equation. This calculation has not been validated in all clinical situations. eGFR's persistently <60 mL/min signify possible Chronic Kidney Disease. 06/27/2008 0840    I No results found for this basename: SPEP,  UPEP,   kappa and lambda light chains    Lab Results  Component Value Date   WBC 7.4 06/02/2013   NEUTROABS 4.9 06/02/2013   HGB 13.8 06/02/2013   HCT 40.1 06/02/2013   MCV 93.3 06/02/2013   PLT 211 06/02/2013      Chemistry      Component Value Date/Time   NA 139 08/25/2013 0828   NA 139 12/04/2011 0838   K 4.3 08/25/2013 0828   K 4.5 12/04/2011 0838   CL 103 12/04/2011 0838   CO2 22 08/25/2013 0828   CO2 26 12/04/2011 0838   BUN 12.6 08/25/2013 0828   BUN 14 12/04/2011 0838   CREATININE 1.0 08/25/2013 0828   CREATININE 0.92 12/04/2011 0838      Component Value Date/Time   CALCIUM 9.4 08/25/2013 0828   CALCIUM 9.6 12/04/2011 0838   ALKPHOS 109 08/25/2013 0828   ALKPHOS 97 12/04/2011 0838   AST 51* 08/25/2013 0828   AST 31 12/04/2011 0838   ALT 68* 08/25/2013 0828   ALT 43* 12/04/2011 0838   BILITOT 0.39 08/25/2013 0828   BILITOT 0.4 12/04/2011 0838       Lab Results  Component Value Date   LABCA2 24 12/04/2011    No components found with this basename: [id number]    No results found for this basename: INR,  in the last 168 hours  Urinalysis    Component Value Date/Time   COLORURINE YELLOW 01/20/2008  [place] 01/20/2008 1418   LABSPEC 1.011 01/20/2008 1418   PHURINE 6.5 01/20/2008 [place] 01/20/2008 1418   HGBUR MODERATE* 01/20/2008 [place] 01/20/2008 [place] 01/20/2008 [place] 01/20/2008 1418   UROBILINOGEN 0.2 01/20/2008 1418    NITRITE NEGATIVE 01/20/2008 1418   LEUKOCYTESUR NEGATIVE 01/20/2008 1418    STUDIES: Mm Digital Screening Unilat R  12/03/2012   *RADIOLOGY REPORT*  Clinical Data: Screening.  DIGITAL SCREENING UNILATERAL RIGHT MAMMOGRAM WITH CAD  Comparison:  Previous exam(s).  FINDINGS:  ACR Breast Density Category d:  The breast tissue is extremely dense, which lowers the sensitivity of mammography.  There are no findings suspicious for malignancy. Right retropectoral saline implant in place.  Images were processed with CAD.  IMPRESSION: No mammographic evidence of malignancy.  A result letter of this screening mammogram will be mailed directly to the patient.  RECOMMENDATION: Screening mammogram in one year. (Code:SM-B-01Y)  BI-RADS CATEGORY 1:  Negative.   Original Report Authenticated By: Lovey Newcomer, M.D    ASSESSMENT/PLAN: 48 y.o. [place]   (1) status post left mastectomy and sentinel lymph node sampling 01/24/2008 for an mpT2 pN0, stage IIA invasive lobular breast cancer, grade 1, estrogen receptor 93% positive, progesterone receptor 100% positive, MIB-16%, and HER-2 amplified by CISH with a ratio of 2.02  (2) Oncotype DX score of 18 predicted a distant recurrence within the next 10 years of 18% if the patient's only systemic treatment was tamoxifen for 5 years. HER-2 was negative on this study.  (3) Adjuvant chemotherapy consisted of cyclophosphamide and docetaxel x4, completed January of 2010  (4) adjuvant anti-estrogen therapy consisted of tamoxifen between February of 2010 and February of 2013, and anastrozole started at that point and completed on 06/03/2013  (5) status post TAH BSO with benign pathology 06/09/2011  (6) Elevation in liver functions most likely secondary to alcohol intake versus rule out breast cancer recurrence:  Her CAT scan of the abdomen which was performed on 06/14/2013 revealed fatty infiltration of liver and nonspecific 14/11 mm right lobe liver nodule. In view of  persistent elevation of liver enzymes we'll obtain MRI of the abdomen to further evaluate the liver nodule. If necessary after review of MRI of the abdomen will arrange for biopsy of the liver nodule. I have counseled the patient on stop drinking alcohol.We will refer the patient to GI for further evaluation of elevated liver enzymes. I have ordered the PT/INR to further assess the liver status  (9) Her annual digital mammogram performed August 2014 revealed no mammographic evidence of any malignancy .She was scheduled for her MRI of the bilateral breast as per Dr. Darlyn Chamber recommendations   Follow up visit in 6 months with CBC differential, CMP and for review of mammogram. She might have to see [place] sooner than anticipated if  her diagnostic workup reveals any abnormalities  Ms. Bruski is in agreement with the current plan of care. She knows to call us with any questions or concerns prior to her next visit   Wilmon Arms, MD  Medical oncology   08/25/2013 9:29 AM

## 2013-09-01 ENCOUNTER — Ambulatory Visit (HOSPITAL_COMMUNITY)
Admission: RE | Admit: 2013-09-01 | Discharge: 2013-09-01 | Disposition: A | Discharge status: Home / Self Care | Payer: 59 | Source: Ambulatory Visit | Attending: Hematology and Oncology | Admitting: Hematology and Oncology

## 2013-09-01 DIAGNOSIS — K7689 Other specified diseases of liver: Secondary | ICD-10-CM | POA: Insufficient documentation

## 2013-09-01 DIAGNOSIS — D493 Neoplasm of unspecified behavior of breast: Secondary | ICD-10-CM

## 2013-09-01 DIAGNOSIS — C50919 Malignant neoplasm of unspecified site of unspecified female breast: Secondary | ICD-10-CM | POA: Insufficient documentation

## 2013-09-01 MED ORDER — GADOBENATE DIMEGLUMINE 529 MG/ML IV SOLN
18.0000 mL | Freq: Once | INTRAVENOUS | Status: AC | PRN
  Administered 2013-09-01: 18 mL via INTRAVENOUS

## 2013-10-13 ENCOUNTER — Telehealth: Payer: Self-pay | Admitting: *Deleted

## 2013-10-13 ENCOUNTER — Other Ambulatory Visit: Payer: Self-pay | Admitting: Physician Assistant

## 2013-10-13 NOTE — Telephone Encounter (Signed)
Called to verify Rx's for pt (Venlafaxine and Gabapentin). Pt is taking as directed. Last filled 07/26/2013. Pt will be getting Rx's through [facility].

## 2013-10-24 ENCOUNTER — Other Ambulatory Visit: Payer: Self-pay | Admitting: *Deleted

## 2013-10-24 DIAGNOSIS — C50919 Malignant neoplasm of unspecified site of unspecified female breast: Secondary | ICD-10-CM

## 2013-10-24 DIAGNOSIS — D493 Neoplasm of unspecified behavior of breast: Secondary | ICD-10-CM

## 2013-10-24 MED ORDER — GABAPENTIN 100 MG PO CAPS: 100.0000 mg | ORAL_CAPSULE | Freq: Three times a day (TID) | ORAL | Status: AC

## 2013-10-24 MED ORDER — VENLAFAXINE HCL 37.5 MG PO TABS: 37.5000 mg | ORAL_TABLET | Freq: Every day | ORAL | Status: AC

## 2013-11-18 ENCOUNTER — Telehealth: Payer: Self-pay | Admitting: Internal Medicine

## 2013-11-18 ENCOUNTER — Other Ambulatory Visit: Payer: Self-pay

## 2013-11-18 DIAGNOSIS — G4733 Obstructive sleep apnea (adult) (pediatric): Secondary | ICD-10-CM

## 2013-11-18 DIAGNOSIS — Z853 Personal history of malignant neoplasm of breast: Secondary | ICD-10-CM

## 2013-11-18 DIAGNOSIS — Z1231 Encounter for screening mammogram for malignant neoplasm of breast: Secondary | ICD-10-CM

## 2013-11-18 NOTE — Telephone Encounter (Signed)
Pt seen last 03/31/2013; was given referral for oral appliance and since then does not want to go through with it; she would rather go ahead with CPAP trial. CY, please advise if okay to order CPAP and what pressures,etc to order. Thanks.

## 2013-11-20 NOTE — Telephone Encounter (Signed)
Order DME new CPAP auto 5-20, mask of choice, humidifier, supplies.   Dx OSA Will need ov 60-90 days

## 2013-11-21 NOTE — Telephone Encounter (Signed)
Order placed per [facility] request Pt aware. OV made for follow up-- 02/21/14 at 345p with CY Nothing further needed.

## 2013-11-24 ENCOUNTER — Telehealth: Payer: Self-pay | Admitting: Internal Medicine

## 2013-11-24 DIAGNOSIS — F32A Depression, unspecified: Secondary | ICD-10-CM | POA: Insufficient documentation

## 2013-11-24 DIAGNOSIS — F329 Major depressive disorder, single episode, unspecified: Secondary | ICD-10-CM | POA: Insufficient documentation

## 2013-11-24 NOTE — Telephone Encounter (Signed)
Spoke with Tommy at [facility], states that due to the patient's AHI being 14.3, her insurance needs a secondary diagnosis to cover her CPAP.  Some suggestions for covered secondary diagnoses were: Hypertension, ischemic heart disease, hx of stroke, excessive daytime sleepiness, insomnia, mood disorders.  Tommy stressed that these are examples but are not limited to this list.    Dr. Annamaria Boots, please advise on a secondary diagnosis for this patient.  Thank you.

## 2013-11-24 NOTE — Telephone Encounter (Signed)
She is on Effexor, indicating a "mood disorder" dx of Depression. This can be added to our problem list and used in support of her CPAP script to DME

## 2013-11-24 NOTE — Telephone Encounter (Signed)
Added Depression to pt's problem list per CY.  Spoke with Jan at [facility], re-faxed paperwork to our triage fax.  Added secondary diagnosis to paperwork and copy of last ov note.  Faxed this back to [facility].  Nothing further needed at this time.

## 2013-12-06 ENCOUNTER — Ambulatory Visit
Admission: RE | Admit: 2013-12-06 | Discharge: 2013-12-06 | Disposition: A | Discharge status: Home / Self Care | Payer: 59 | Source: Ambulatory Visit

## 2013-12-06 ENCOUNTER — Other Ambulatory Visit: Payer: Self-pay

## 2013-12-06 DIAGNOSIS — Z1231 Encounter for screening mammogram for malignant neoplasm of breast: Secondary | ICD-10-CM

## 2013-12-06 DIAGNOSIS — Z853 Personal history of malignant neoplasm of breast: Secondary | ICD-10-CM

## 2014-02-21 ENCOUNTER — Ambulatory Visit: Payer: 59 | Admitting: Internal Medicine

## 2014-02-27 ENCOUNTER — Telehealth: Payer: Self-pay | Admitting: Oncology

## 2014-02-27 NOTE — Telephone Encounter (Signed)
per pof to sch pt appt-gave pt copy of sch °

## 2014-03-02 ENCOUNTER — Ambulatory Visit: Payer: 59 | Admitting: Oncology

## 2014-03-02 ENCOUNTER — Other Ambulatory Visit: Payer: 59

## 2014-04-12 ENCOUNTER — Telehealth: Payer: Self-pay | Admitting: Nurse Practitioner

## 2014-04-12 NOTE — Telephone Encounter (Signed)
pt cld to r/s appt-gave pt time & date-pt understood

## 2014-04-21 DEATH — deceased

## 2014-04-26 ENCOUNTER — Ambulatory Visit: Payer: 59 | Admitting: Nurse Practitioner

## 2014-04-26 ENCOUNTER — Other Ambulatory Visit: Payer: 59

## 2017-06-24 NOTE — Progress Notes (Signed)
This encounter was created in error - please disregard.
